# Patient Record
Sex: Male | Born: 1970 | Race: White | Hispanic: No | Marital: Married | Smoking: Never smoker
Health system: Southern US, Community
[De-identification: ages and names within clinical notes are randomized; demographics above are authoritative.]

## PROBLEM LIST (undated history)

## (undated) DIAGNOSIS — G629 Polyneuropathy, unspecified: Secondary | ICD-10-CM

## (undated) DIAGNOSIS — I10 Essential (primary) hypertension: Secondary | ICD-10-CM

## (undated) DIAGNOSIS — E119 Type 2 diabetes mellitus without complications: Secondary | ICD-10-CM

## (undated) HISTORY — PX: OTHER SURGICAL HISTORY: SHX169

---

## 2005-11-23 IMAGING — CR DG CHEST 2V
1 series · 2 of 2 positions shown · non-contrast
Comparison: none

REASON FOR EXAM: Shortness of breath
COMMENTS:

PROCEDURE:     DXR - DXR CHEST PA (OR AP) AND LATERAL  - [DATE]  [DATE]
RESULT:     The lung fields are clear. The heart, mediastinal and osseous
structures show no significant abnormalities.

[Series 1: view not recorded · 0.17mm/px · 2 of 2 slices shown]
[im 1/2]
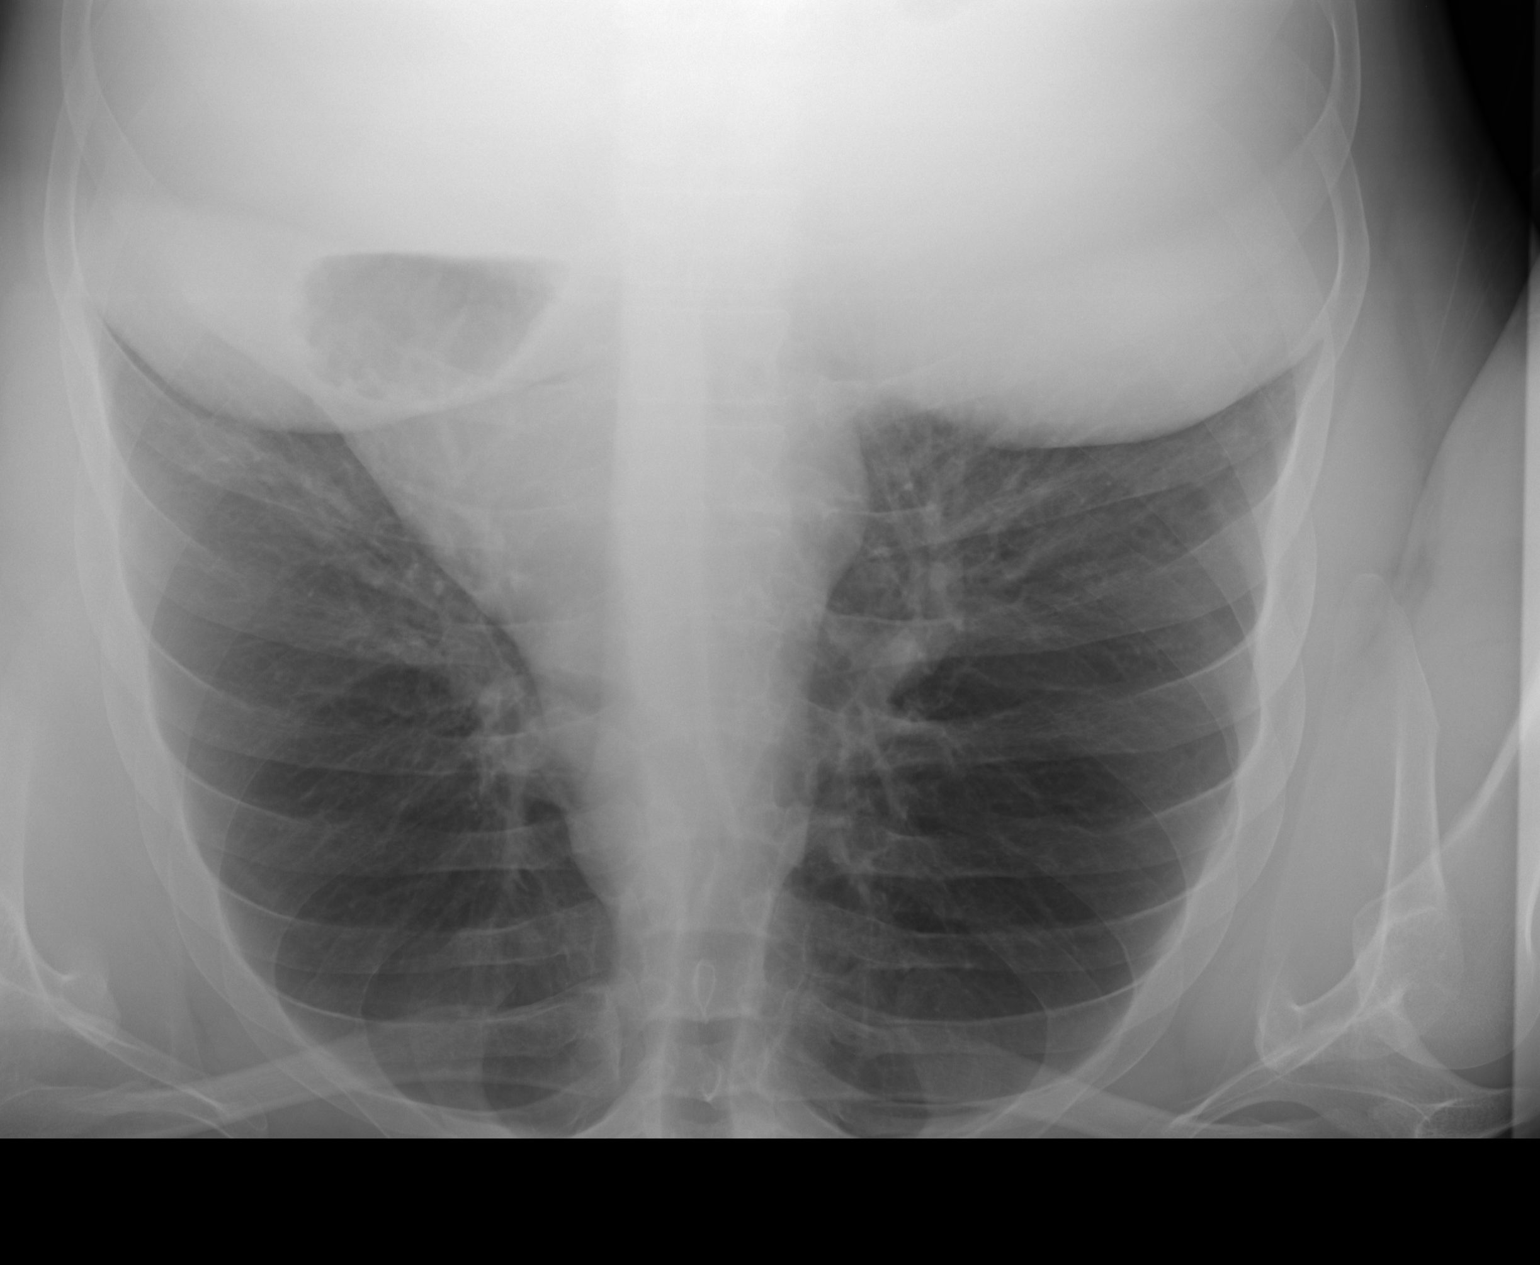
[im 2/2]
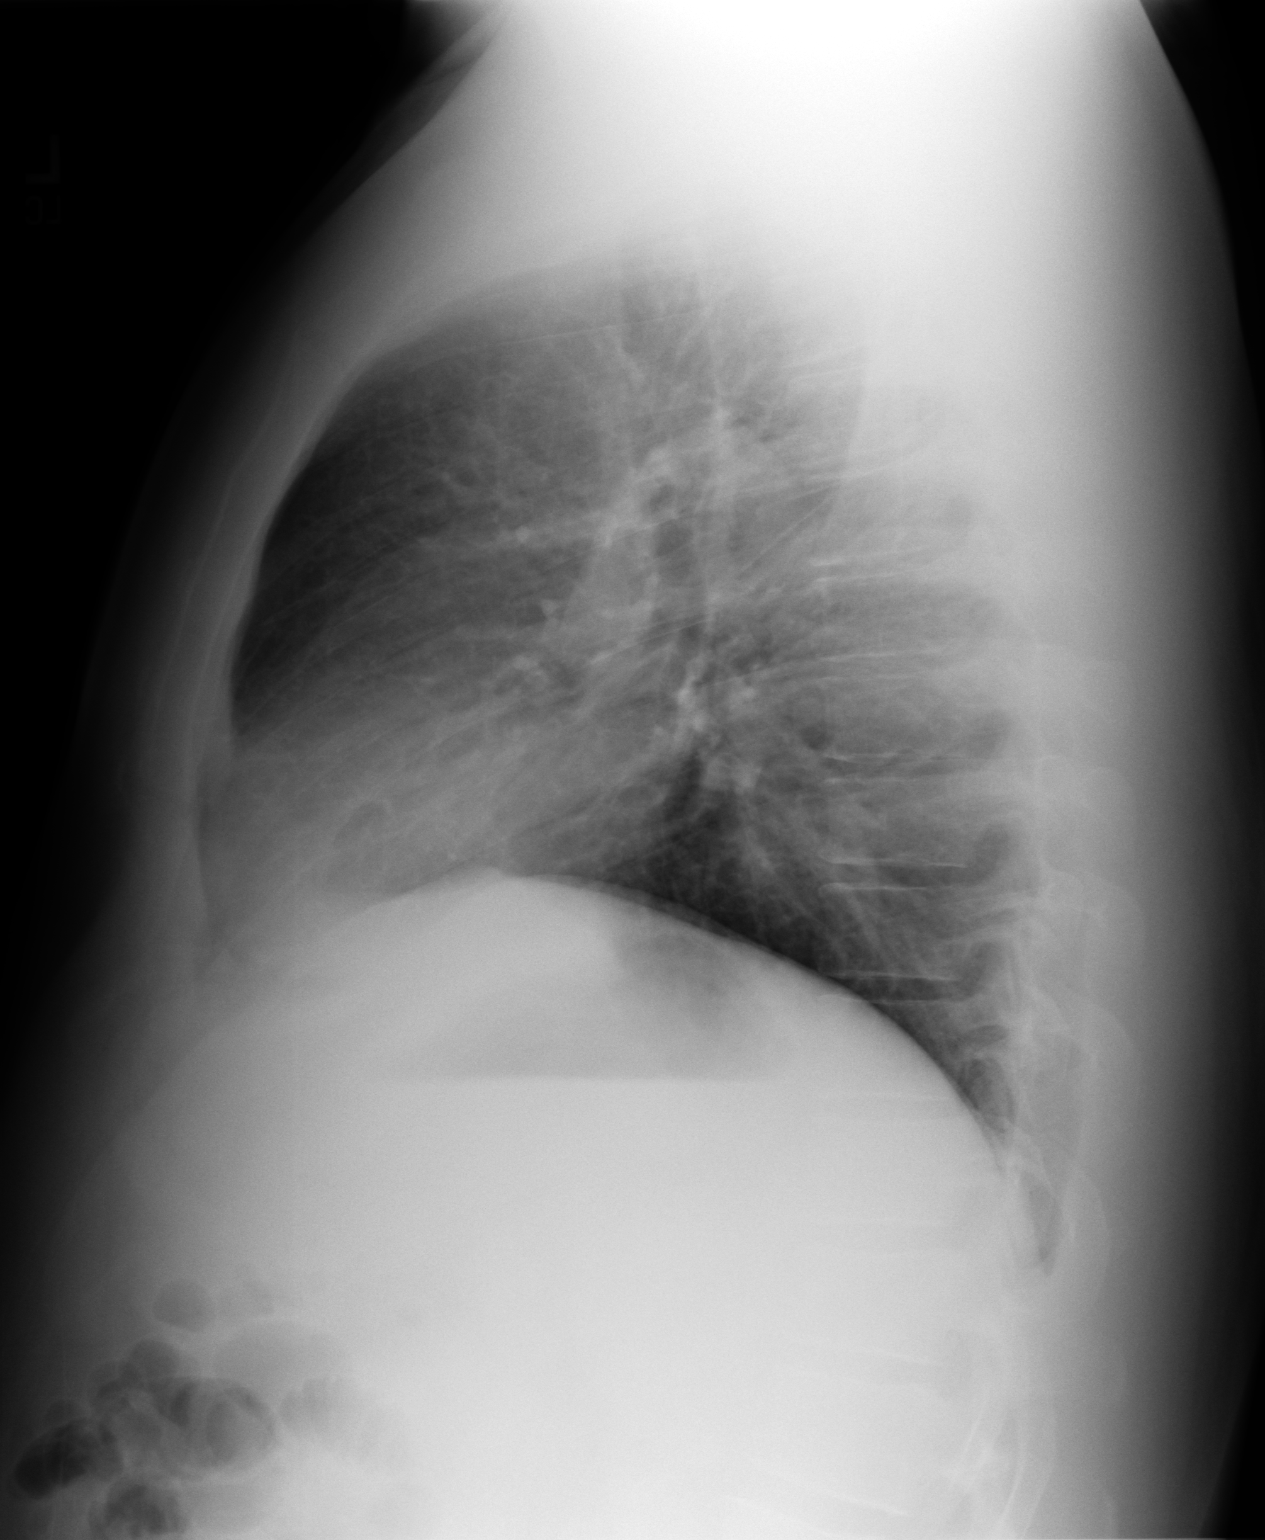

[2 of 2 positions shown; findings below may reference images not displayed]

IMPRESSION: No significant abnormalities are noted.

## 2005-11-24 ENCOUNTER — Inpatient Hospital Stay: Payer: Self-pay | Admitting: Internal Medicine

## 2021-08-03 ENCOUNTER — Observation Stay
Admission: EM | Admit: 2021-08-03 | Discharge: 2021-08-04 | Disposition: A | Discharge status: Home / Self Care | Payer: Self-pay | Attending: Internal Medicine | Admitting: Internal Medicine

## 2021-08-03 ENCOUNTER — Observation Stay: Payer: Self-pay

## 2021-08-03 ENCOUNTER — Other Ambulatory Visit: Payer: Self-pay

## 2021-08-03 ENCOUNTER — Emergency Department: Payer: Self-pay

## 2021-08-03 DIAGNOSIS — I639 Cerebral infarction, unspecified: Principal | ICD-10-CM | POA: Diagnosis present

## 2021-08-03 DIAGNOSIS — Z23 Encounter for immunization: Secondary | ICD-10-CM | POA: Insufficient documentation

## 2021-08-03 DIAGNOSIS — I6300 Cerebral infarction due to thrombosis of unspecified precerebral artery: Secondary | ICD-10-CM

## 2021-08-03 DIAGNOSIS — Z20822 Contact with and (suspected) exposure to covid-19: Secondary | ICD-10-CM | POA: Insufficient documentation

## 2021-08-03 DIAGNOSIS — G629 Polyneuropathy, unspecified: Secondary | ICD-10-CM

## 2021-08-03 DIAGNOSIS — R531 Weakness: Secondary | ICD-10-CM

## 2021-08-03 DIAGNOSIS — R609 Edema, unspecified: Secondary | ICD-10-CM

## 2021-08-03 DIAGNOSIS — I10 Essential (primary) hypertension: Secondary | ICD-10-CM | POA: Diagnosis present

## 2021-08-03 DIAGNOSIS — E119 Type 2 diabetes mellitus without complications: Secondary | ICD-10-CM

## 2021-08-03 HISTORY — DX: Essential (primary) hypertension: I10

## 2021-08-03 HISTORY — DX: Type 2 diabetes mellitus without complications: E11.9

## 2021-08-03 HISTORY — DX: Polyneuropathy, unspecified: G62.9

## 2021-08-03 LAB — COMPREHENSIVE METABOLIC PANEL
ALT: 24 U/L (ref 0–44)
AST: 23 U/L (ref 15–41)
Albumin: 4.3 g/dL (ref 3.5–5.0)
Alkaline Phosphatase: 60 U/L (ref 38–126)
Anion gap: 10 (ref 5–15)
BUN: 16 mg/dL (ref 6–20)
CO2: 26 mmol/L (ref 22–32)
Calcium: 9.6 mg/dL (ref 8.9–10.3)
Chloride: 99 mmol/L (ref 98–111)
Creatinine, Ser: 1.1 mg/dL (ref 0.61–1.24)
GFR, Estimated: >60 mL/min (ref >60)
Glucose, Bld: 245 mg/dL — ABNORMAL HIGH (ref 70–99)
Potassium: 3.7 mmol/L (ref 3.5–5.1)
Sodium: 135 mmol/L (ref 135–145)
Total Bilirubin: 2.1 mg/dL — ABNORMAL HIGH (ref 0.3–1.2)
Total Protein: 8.2 g/dL — ABNORMAL HIGH (ref 6.5–8.1)

## 2021-08-03 LAB — CBC
HCT: 42.9 % (ref 39.0–52.0)
Hemoglobin: 14.8 g/dL (ref 13.0–17.0)
MCH: 30.6 pg (ref 26.0–34.0)
MCHC: 34.5 g/dL (ref 30.0–36.0)
MCV: 88.8 fL (ref 80.0–100.0)
Platelets: 265 10*3/uL (ref 150–400)
RBC: 4.83 MIL/uL (ref 4.22–5.81)
RDW: 12.3 % (ref 11.5–15.5)
WBC: 7.6 10*3/uL (ref 4.0–10.5)
nRBC: 0 % (ref 0.0–0.2)

## 2021-08-03 LAB — PROTIME-INR
INR: 1 (ref 0.8–1.2)
Prothrombin Time: 13.4 seconds (ref 11.4–15.2)

## 2021-08-03 LAB — GLUCOSE, CAPILLARY
Glucose-Capillary: 254 mg/dL — ABNORMAL HIGH (ref 70–99)
Glucose-Capillary: 187 mg/dL — ABNORMAL HIGH (ref 70–99)

## 2021-08-03 LAB — DIFFERENTIAL
Abs Immature Granulocytes: 0.02 10*3/uL (ref 0.00–0.07)
Basophils Absolute: 0 10*3/uL (ref 0.0–0.1)
Basophils Relative: 1 %
Eosinophils Absolute: 0.2 10*3/uL (ref 0.0–0.5)
Eosinophils Relative: 2 %
Immature Granulocytes: 0 %
Lymphocytes Relative: 35 %
Lymphs Abs: 2.7 10*3/uL (ref 0.7–4.0)
Monocytes Absolute: 0.6 10*3/uL (ref 0.1–1.0)
Monocytes Relative: 8 %
Neutro Abs: 4.1 10*3/uL (ref 1.7–7.7)
Neutrophils Relative %: 54 %

## 2021-08-03 LAB — APTT: aPTT: 30 seconds (ref 24–36)

## 2021-08-03 LAB — CBG MONITORING, ED: Glucose-Capillary: 225 mg/dL — ABNORMAL HIGH (ref 70–99)

## 2021-08-03 LAB — RESP PANEL BY RT-PCR (FLU A&B, COVID) ARPGX2
Influenza A by PCR: NEGATIVE
Influenza B by PCR: NEGATIVE
SARS Coronavirus 2 by RT PCR: NEGATIVE

## 2021-08-03 IMAGING — CT CT ANGIO NECK
2 of 7 series · 9 of 36 positions shown · IV contrast (omnipaque)
Comparison: Prior brain MRI from earlier the same day.

CLINICAL DATA: Follow-up examination for acute stroke.

EXAM:
CT ANGIOGRAPHY NECK
TECHNIQUE: Multidetector CT imaging of the neck was performed using the
standard protocol during bolus administration of intravenous
contrast. Multiplanar CT image reconstructions and MIPs were
obtained to evaluate the vascular anatomy. Carotid stenosis
measurements (when applicable) are obtained utilizing NASCET
criteria, using the distal internal carotid diameter as the
denominator.
CONTRAST:  75mL OMNIPAQUE IOHEXOL 350 MG/ML SOLN

[Series 6: ax thin · axial · 0.43mm/px · z∈[-369,-178]mm · 7 of 270 slices shown]
[im 34/270  soft-tissue]
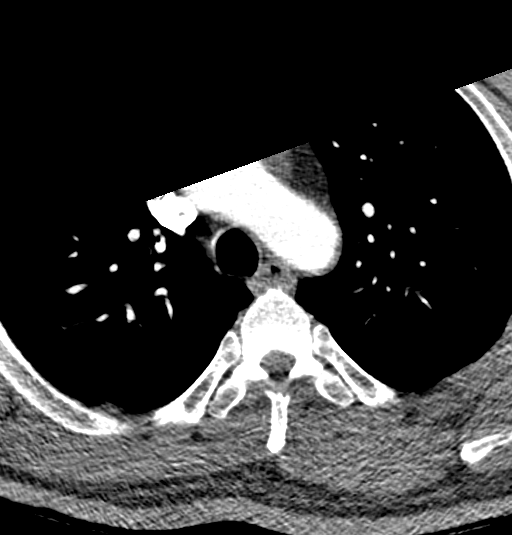
[im 68/270  bone]
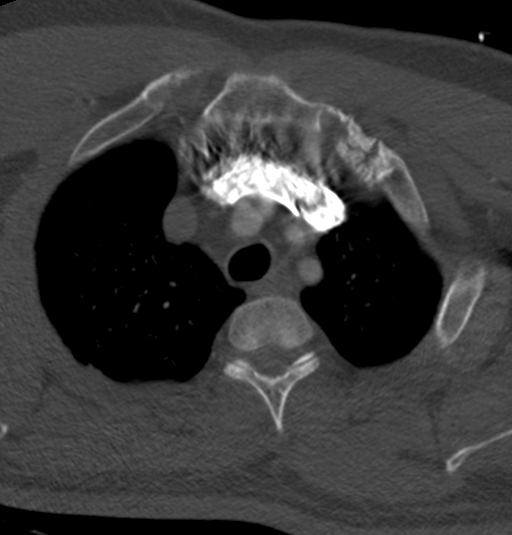
[im 101/270  soft-tissue]
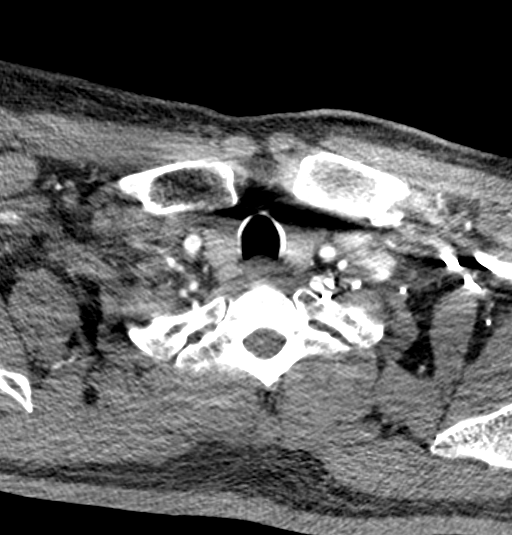
[im 135/270  bone]
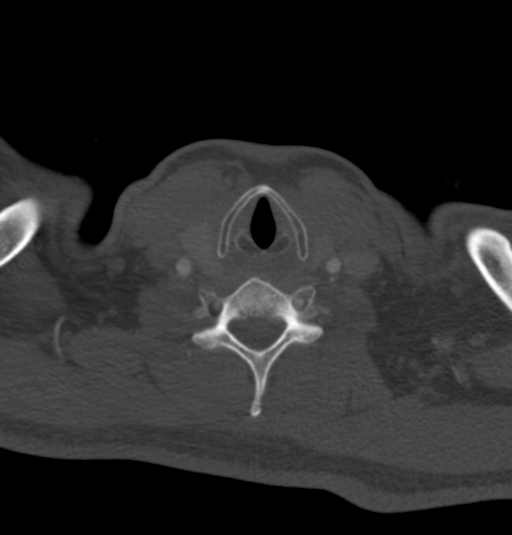
[im 169/270  soft-tissue]
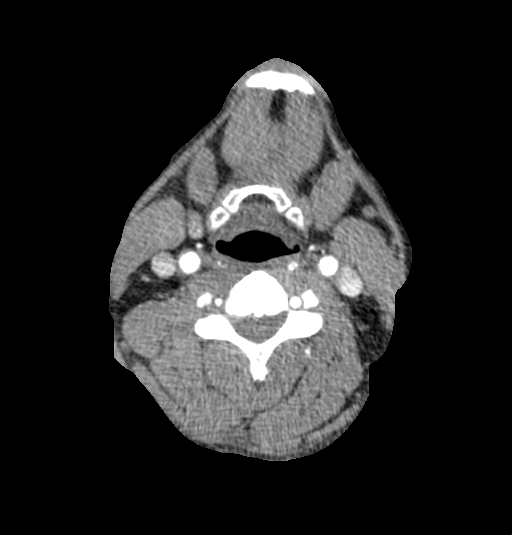
[im 202/270  bone]
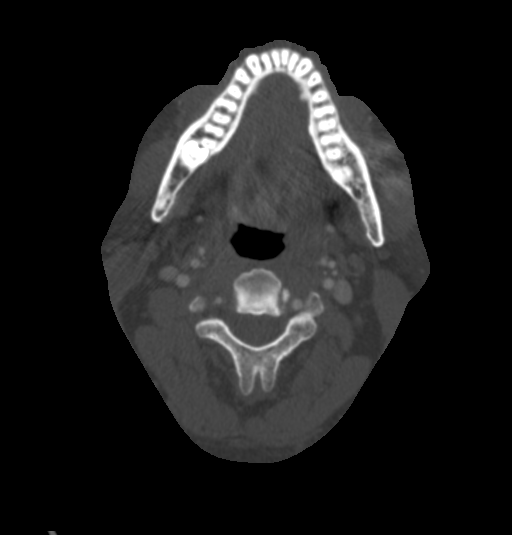
[im 236/270  soft-tissue]
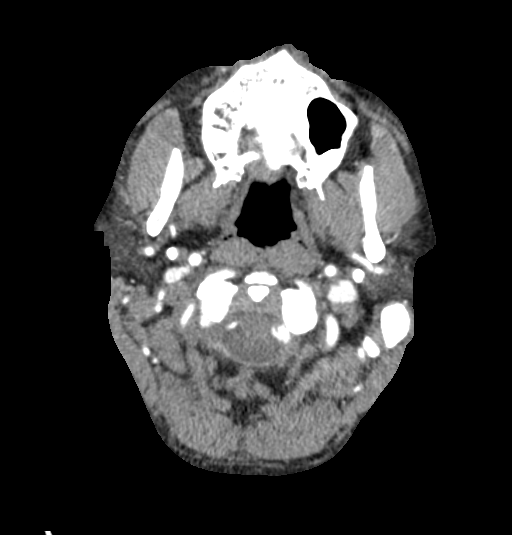

[Series 8: sagittal thin · sagittal · 0.46mm/px · 2 of 158 slices shown]
[im 34/158  soft-tissue]
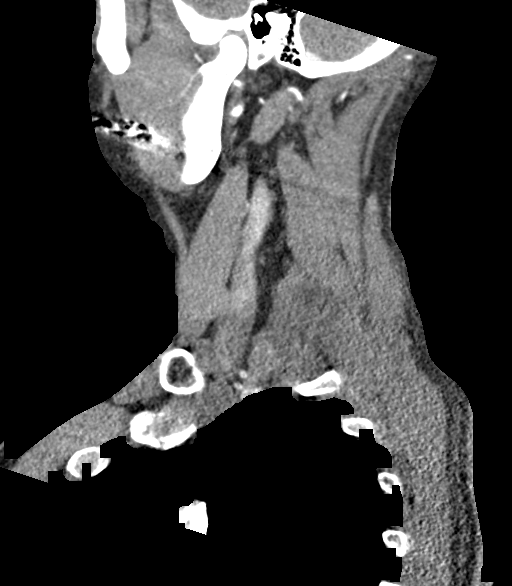
[im 125/158  soft-tissue]
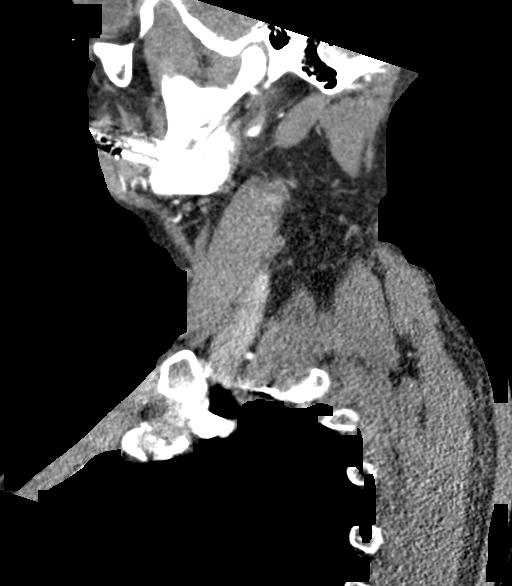

[9 of 36 positions shown; findings below may reference images not displayed]

FINDINGS: Aortic arch: Visualized aortic arch normal in caliber with normal
branch pattern. Minimal atheromatous change about the origin of the
great vessels without significant stenosis.

Right carotid system: Right CCA patent from its origin to the
bifurcation without stenosis. Mild mixed plaque about the right
carotid bulb without significant stenosis. Right ICA patent distally
without stenosis, dissection or occlusion. Atheromatous change noted
within the partially visualized right carotid siphon.

Left carotid system: Left CCA patent from its origin to the
bifurcation without stenosis. Mild calcified plaque about the left
carotid bulb/proximal left ICA without significant stenosis. Left
ICA patent distally without stenosis, dissection or occlusion.
Scattered atheromatous change noted within the partially visualized
left carotid siphon without visible stenosis.

Vertebral arteries: Both vertebral arteries arise from the
subclavian arteries. No significant proximal subclavian artery
stenosis. Left vertebral artery dominant, with a diffusely
diminutive right vertebral artery. Atheromatous change noted at the
origin of the dominant left vertebral artery without definite
significant stenosis. Vertebral arteries otherwise patent distally
without stenosis, evidence for dissection or occlusion. Diminutive
right vertebral artery terminates in PICA. 4 mm partially calcified
density adjacent to the right PICA of could reflect a small
thrombosed aneurysm (series 6, image 241). No internal flow seen
within this lesion. Origin of the left PICA normal. Partially
visualized basilar widely patent.

Skeleton: No discrete or worrisome osseous lesions. No significant
spondylosis for age.

Other neck: No other acute soft tissue abnormality within the neck.
No mass or adenopathy.

Upper chest: Visualized upper chest demonstrates no acute finding.
IMPRESSION: 1. Mild atheromatous change about the carotid bifurcations/proximal
ICAs without hemodynamically significant stenosis.
2. Wide patency of the vertebral arteries within the neck. Left
vertebral artery dominant. Right vertebral artery diminutive and
terminates in PICA.
3. 4 mm partially calcified lesion adjacent to the right PICA,
possibly reflecting a small thrombosed aneurysm. This is likely of
doubtful significance, as no appreciable internal flow is
visualized.

## 2021-08-03 IMAGING — MR MR HEAD W/O CM
13 series · 48 of 48 positions shown · non-contrast
Comparison: No pertinent prior exam.

CLINICAL DATA: Neuro deficit, acute, stroke suspected

EXAM:
MRI HEAD WITHOUT CONTRAST
MRA HEAD WITHOUT CONTRAST
TECHNIQUE: Multiplanar, multi-echo pulse sequences of the brain and surrounding
structures were acquired without intravenous contrast. Angiographic
images of the Circle of Willis were acquired using MRA technique
without intravenous contrast.

[Series 5: ax dwi_tracew · axial · 3.0mm · 0.65mm/px · z∈[-56,+98]mm · 2 of 48 slices shown]
[im 1/48]
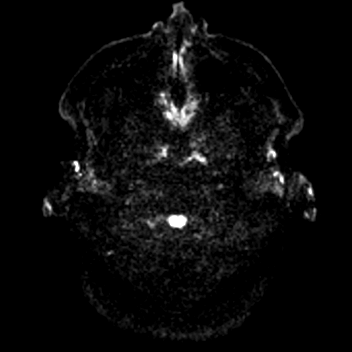
[im 48/48]
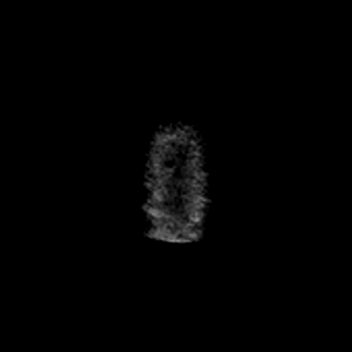

[Series 6: ax dwi_adc · axial · 3.0mm · 0.65mm/px · z∈[-56,+98]mm · 2 of 48 slices shown]
[im 1/48]
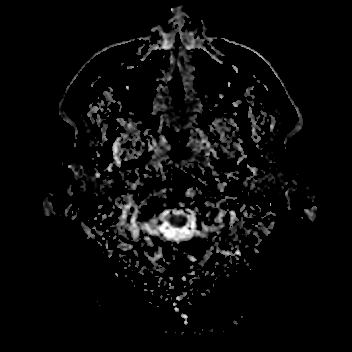
[im 48/48]
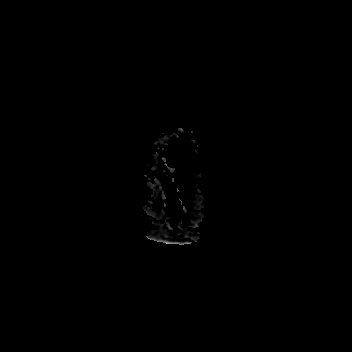

[Series 7: cor dwi_tracew · coronal · 5.0mm · 1.80mm/px · 3 of 38 slices shown]
[im 1/38]
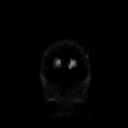
[im 19/38]
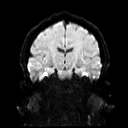
[im 38/38]
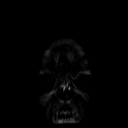

[Series 8: cor dwi_adc · coronal · 5.0mm · 1.80mm/px · 3 of 38 slices shown]
[im 1/38]
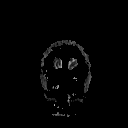
[im 19/38]
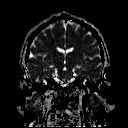
[im 38/38]
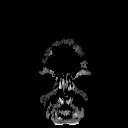

[Series 9: FLAIR · axial · 3.0mm · 0.53mm/px · z∈[-60,+101]mm · 4 of 55 slices shown (1 of 2)]
[im 1/55]
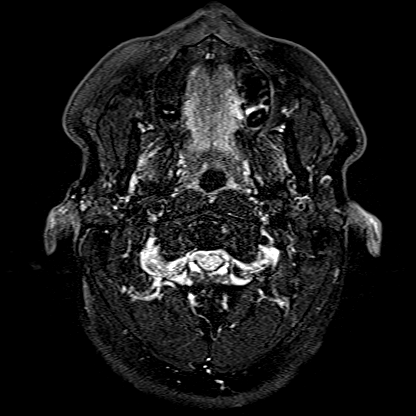
[im 19/55]
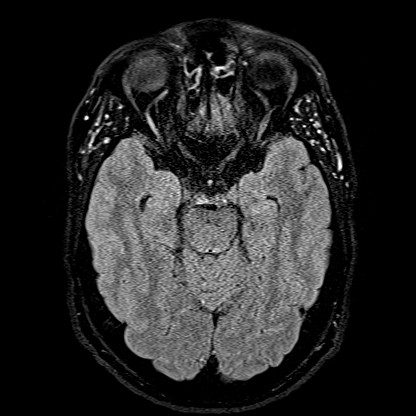
[im 37/55]
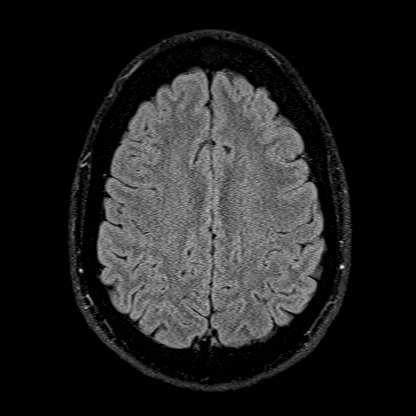
[im 55/55]
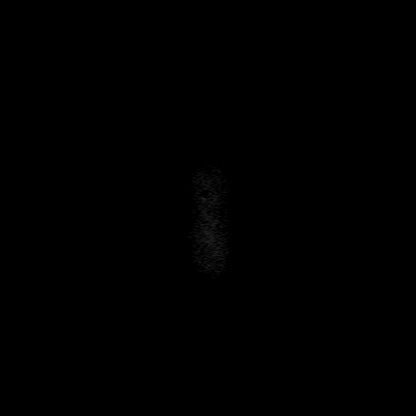

[Series 10: mag_images · axial · 3.0mm · 0.90mm/px · z∈[-66,+109]mm · 4 of 60 slices shown]
[im 1/60]
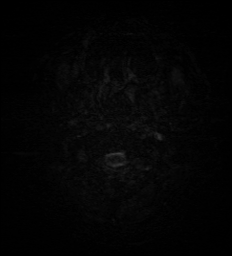
[im 20/60]
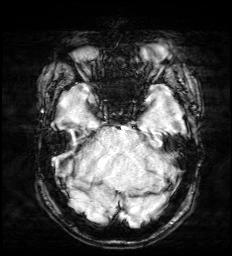
[im 40/60]
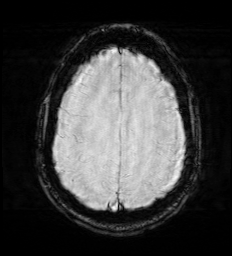
[im 60/60]
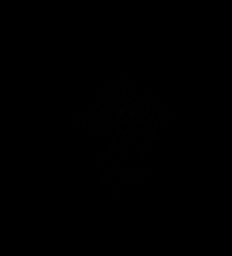

[Series 11: pha_images · axial · 3.0mm · 0.90mm/px · z∈[-66,+106]mm · 4 of 58 slices shown]
[im 1/58]
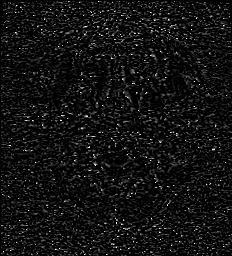
[im 20/58]
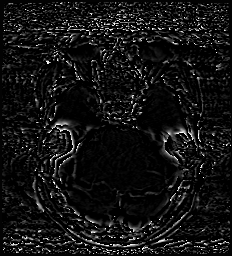
[im 39/58]
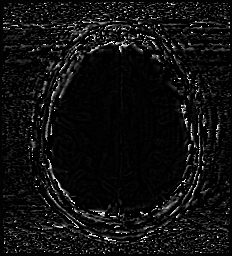
[im 58/58]
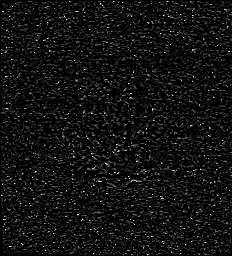

[Series 12: swi_images · axial · 3.0mm · 0.90mm/px · z∈[-66,+109]mm · 4 of 60 slices shown]
[im 1/60]
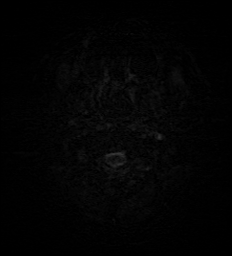
[im 20/60]
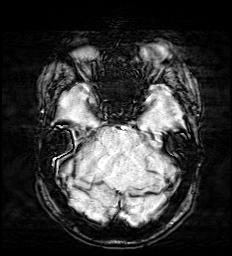
[im 40/60]
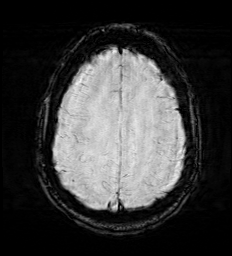
[im 60/60]
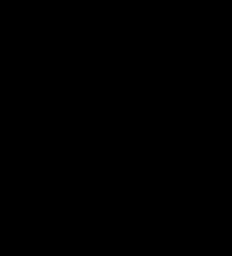

[Series 14: T1 · sagittal · 5.0mm · 0.62mm/px · 2 of 23 slices shown (1 of 2)]
[im 1/23]
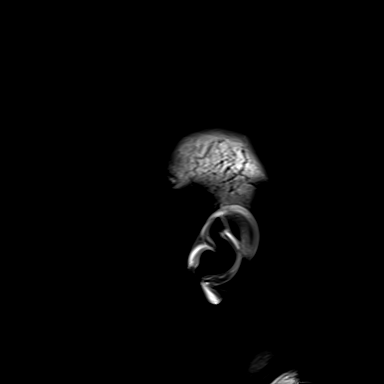
[im 23/23]
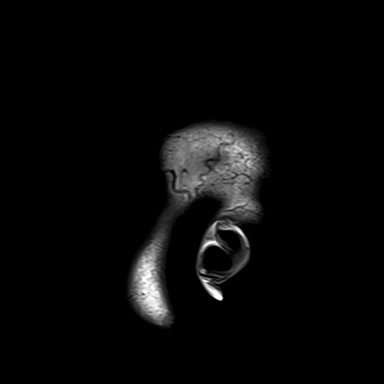

[Series 15: T2 · axial · 5.0mm · 0.53mm/px · z∈[-51,+92]mm · 2 of 25 slices shown (1 of 2)]
[im 1/25]
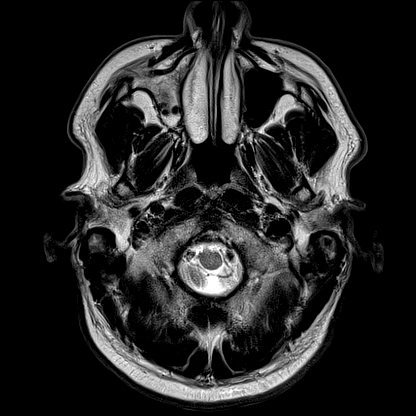
[im 25/25]
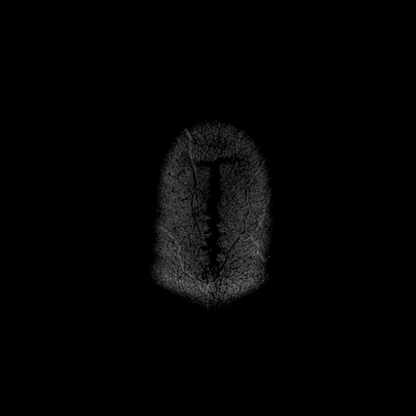

[Series 16: T1 · axial · 1.0mm · 0.98mm/px · z∈[-65,+109]mm · 12 of 176 slices shown (2 of 2)]
[im 1/176]
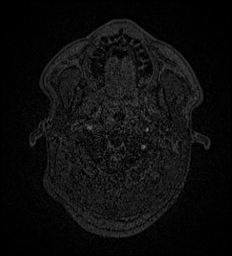
[im 16/176]
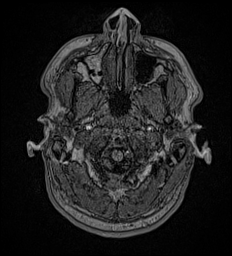
[im 32/176]
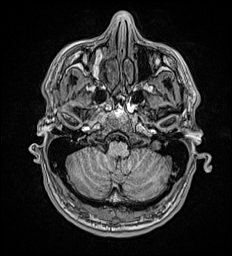
[im 48/176]
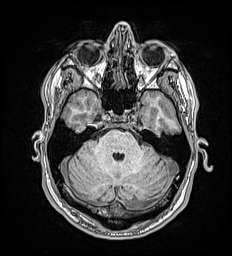
[im 64/176]
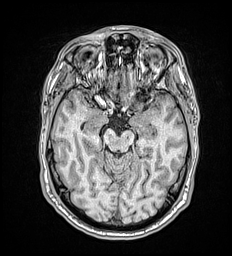
[im 80/176]
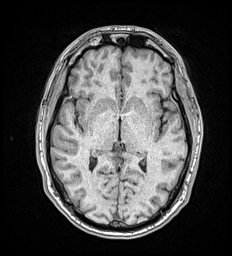
[im 96/176]
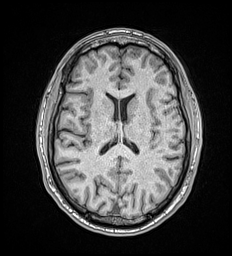
[im 112/176]
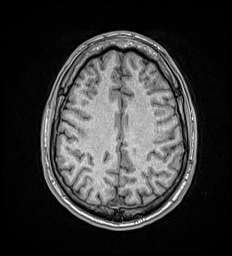
[im 128/176]
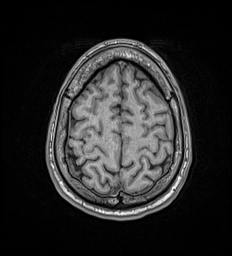
[im 144/176]
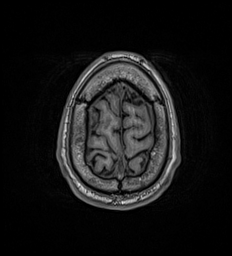
[im 160/176]
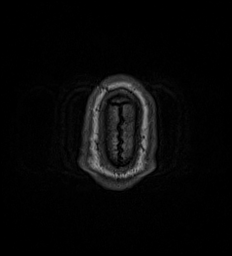
[im 176/176]
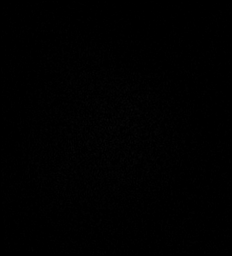

[Series 17: T2 · coronal · 5.0mm · 0.57mm/px · 2 of 29 slices shown (2 of 2)]
[im 1/29]
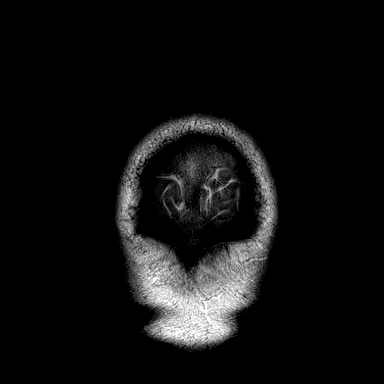
[im 29/29]
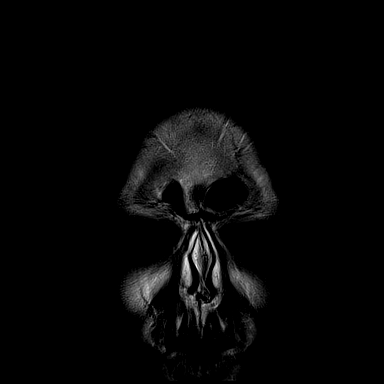

[Series 18: FLAIR · axial · 3.0mm · 0.53mm/px · z∈[-60,+101]mm · 4 of 55 slices shown (2 of 2)]
[im 1/55]
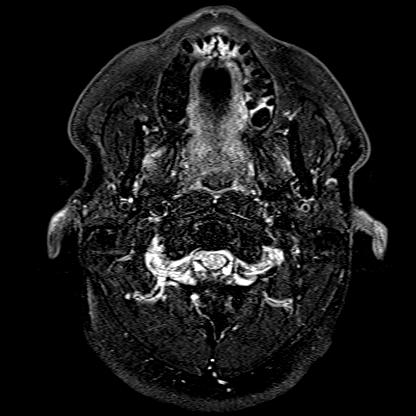
[im 19/55]
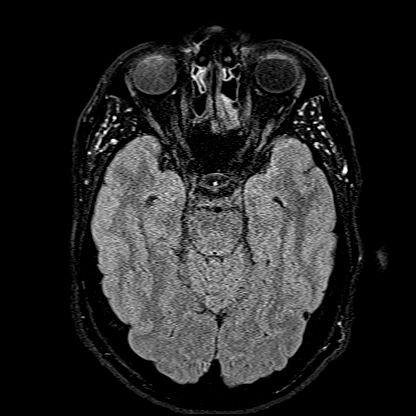
[im 37/55]
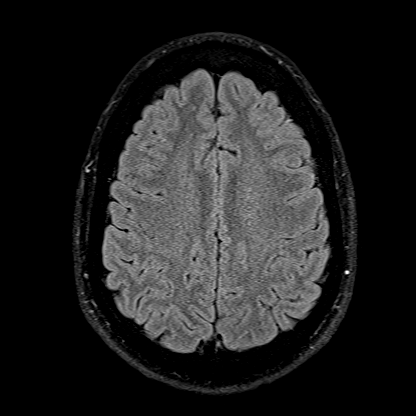
[im 55/55]
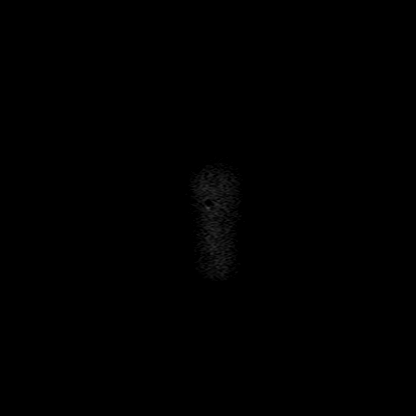

[48 of 48 positions shown; findings below may reference images not displayed]

FINDINGS: MRI HEAD FINDINGS

Brain: There is a 4 mm focus of reduced diffusion at the right
ventral aspect of the superior medulla. No evidence of intracranial
hemorrhage. Ventricles and sulci are normal in size and
configuration. There is no intracranial mass, mass effect,
hydrocephalus, or extra-axial collection. Few patchy foci of T2
hyperintensity in the supratentorial white matter likely reflects
nonspecific gliosis/demyelination.

Vascular: Diminished right vertebral artery flow void.

Skull and upper cervical spine: Normal marrow signal is preserved.

Sinuses/Orbits: Patchy mucosal thickening. Right maxillary sinus is
opacified. Orbits are unremarkable.

Other: Mastoid air cells are clear.

MRA HEAD FINDINGS

Anterior circulation: Intracranial internal carotid arteries are
patent with atherosclerotic irregularity. There is mild stenosis at
the level of the clinoid on the left. Anterior cerebral arteries are
patent. Anterior communicating artery is present. Middle cerebral
arteries are patent with mild atherosclerotic irregularity.

Posterior circulation: Intracranial left vertebral artery is patent.
Proximal right vertebral artery is patent to the PICA origin. There
is no apparent flow beyond this despite vessel being present on the
MRI brain. Basilar artery is patent. Posterior cerebral arteries are
patent.
IMPRESSION: Small acute infarct right ventral medulla.

Intracranial right vertebral artery is patent to the PICA origin.
Beyond this, the vessel appears to be present on MRI brain but there
is no flow visible on MRA.

## 2021-08-03 IMAGING — CT CT HEAD W/O CM
3 series · 16 of 47 positions shown, 19 images · non-contrast
Comparison: None.

CLINICAL DATA: Neurologic deficit.

EXAM:
CT HEAD WITHOUT CONTRAST
TECHNIQUE: Contiguous axial images were obtained from the base of the skull
through the vertex without intravenous contrast.

[Series 3: head wo · axial · 0.44mm/px · z∈[-138,-13]mm · 10 of 31 slices shown, 13 images]
[im 3/31  brain]
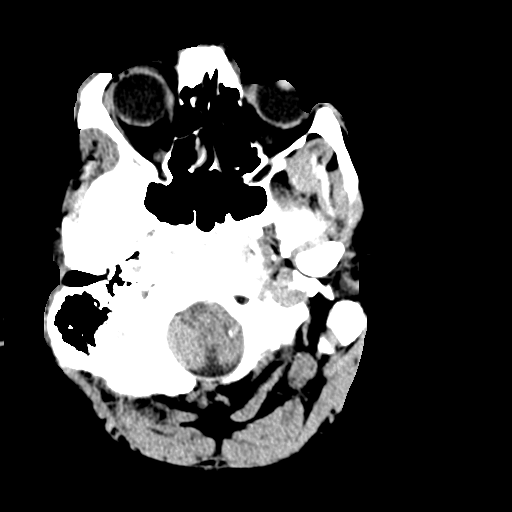
[im 3/31  bone]
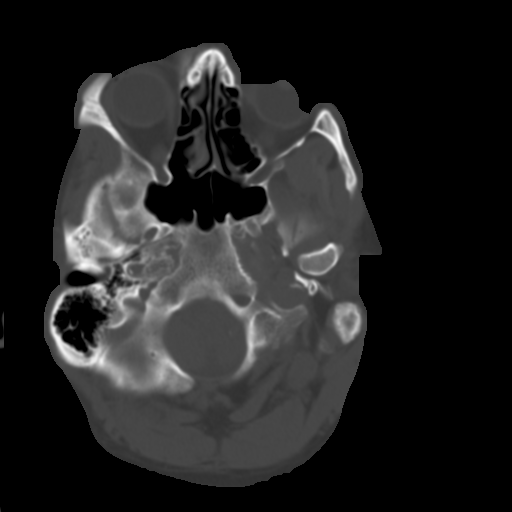
[im 6/31  brain]
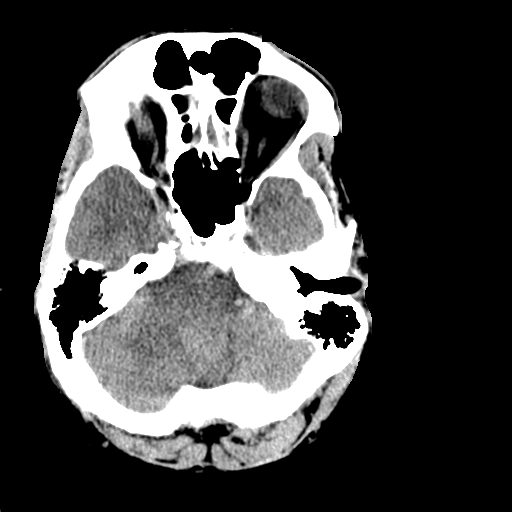
[im 9/31  brain]
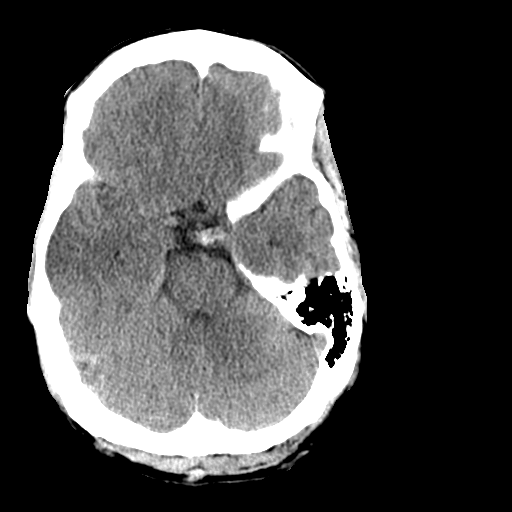
[im 11/31  brain]
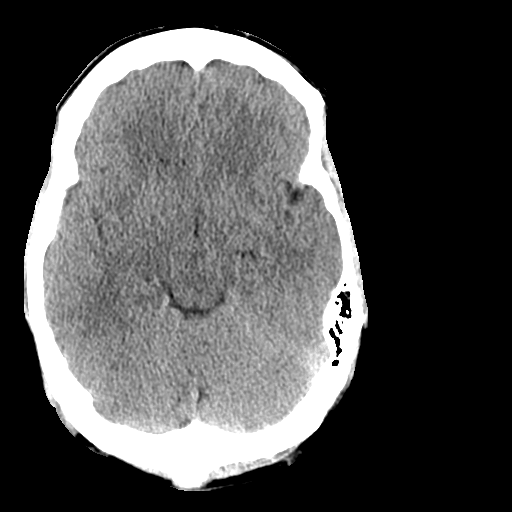
[im 14/31  brain]
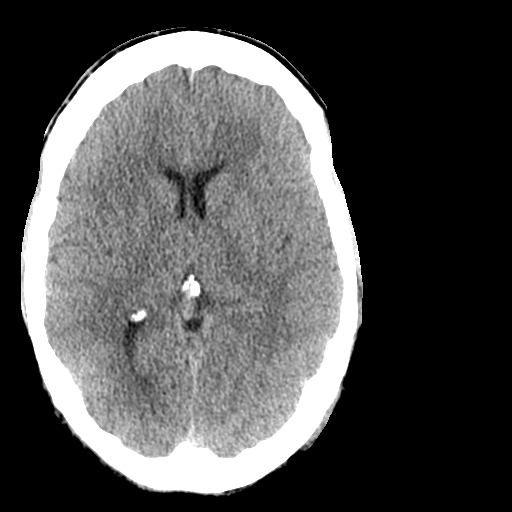
[im 14/31  bone]
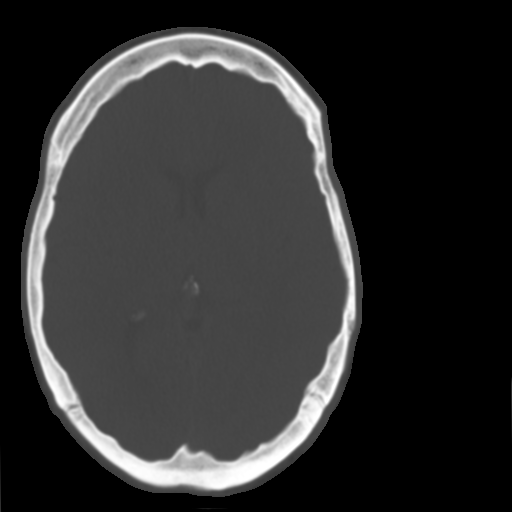
[im 17/31  brain]
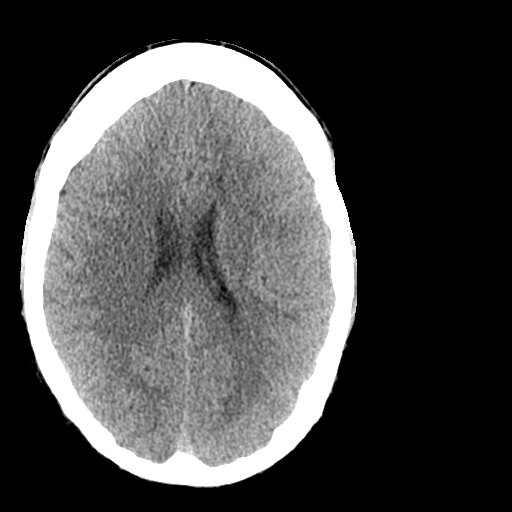
[im 20/31  brain]
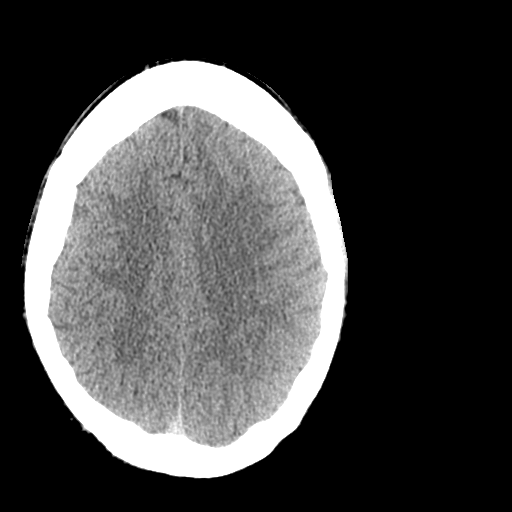
[im 23/31  brain]
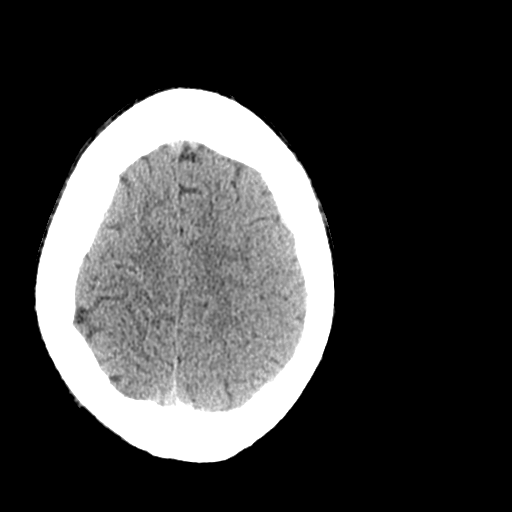
[im 25/31  brain]
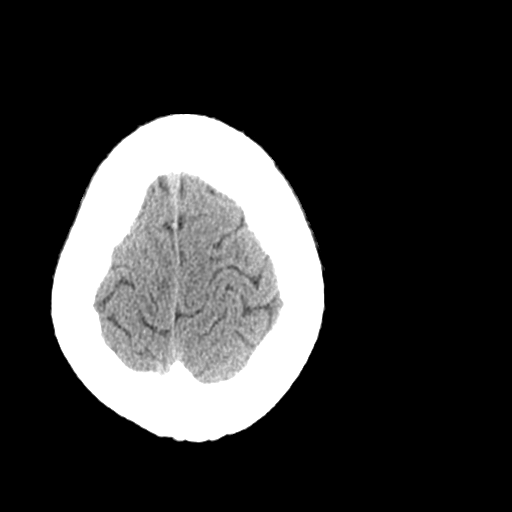
[im 25/31  bone]
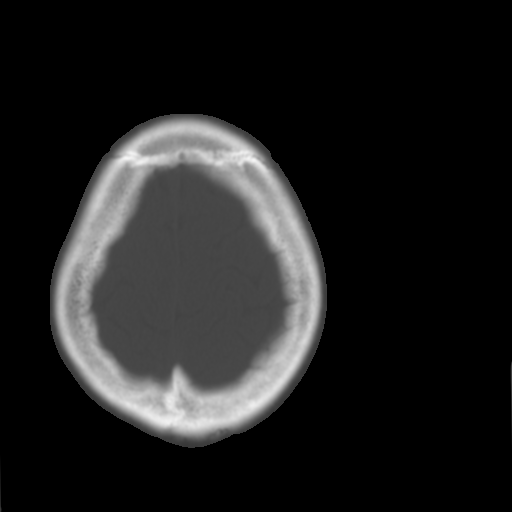
[im 28/31  brain]
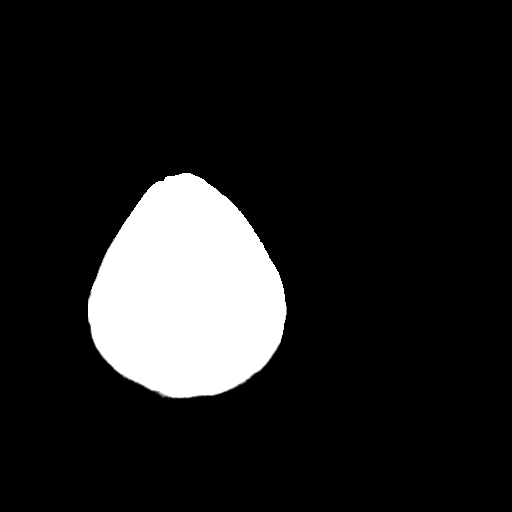

[Series 4: coronal soft tissue · coronal · 0.33mm/px · 3 of 73 slices shown]
[im 25/73  brain]
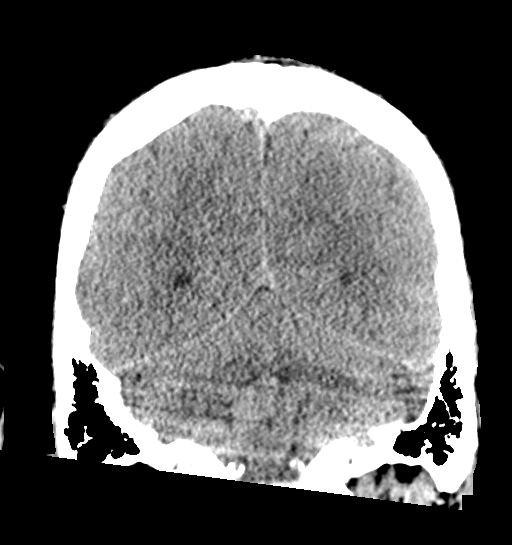
[im 33/73  brain]
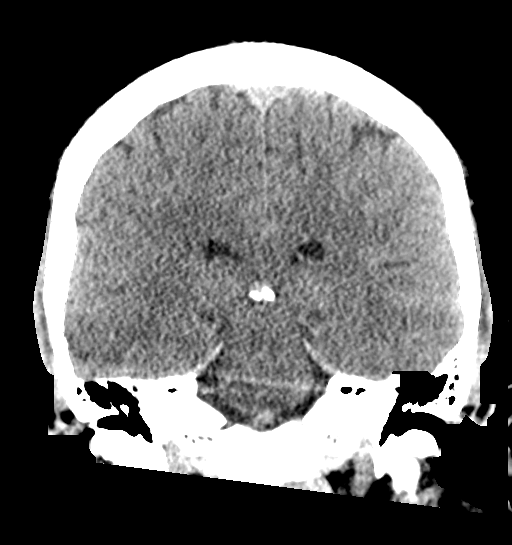
[im 41/73  brain]
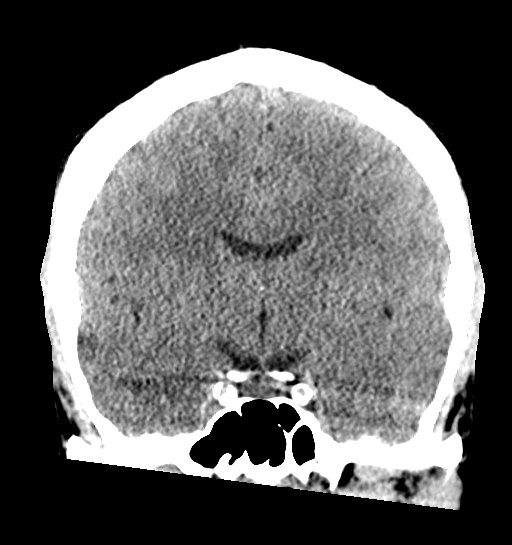

[Series 5: sagittal soft tissue · sagittal · 0.35mm/px · 3 of 57 slices shown]
[im 20/57  brain]
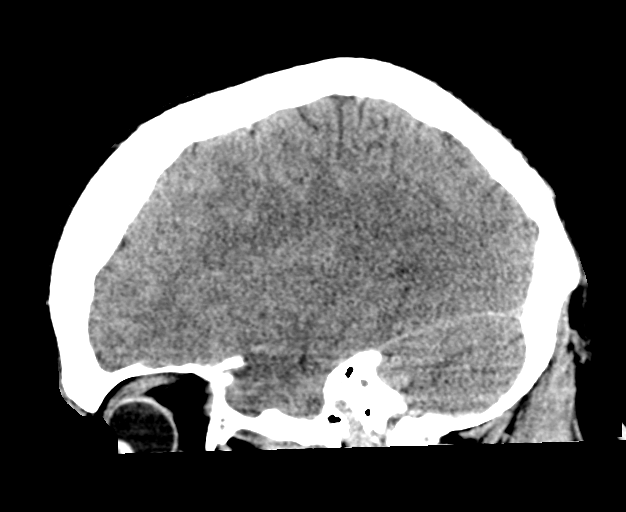
[im 29/57  brain]
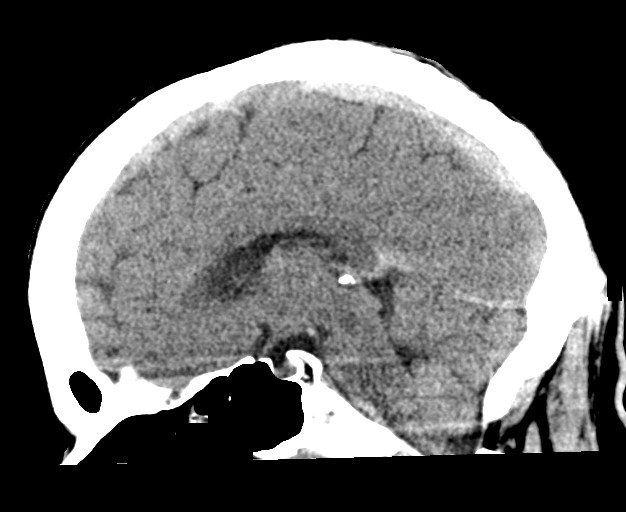
[im 37/57  brain]
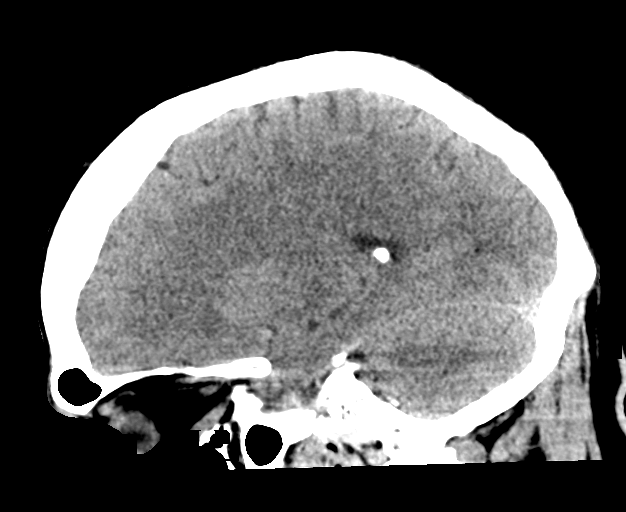

[16 of 47 positions shown; findings below may reference images not displayed]

FINDINGS: Brain: The ventricles and sulci are appropriate size for the
patient's age. The gray-white matter discrimination is preserved.
There is no acute intracranial hemorrhage. No mass effect or midline
shift no extra-axial fluid collection.

Vascular: No hyperdense vessel or unexpected calcification.

Skull: Normal. Negative for fracture or focal lesion.

Sinuses/Orbits: Diffuse mucoperiosteal thickening of paranasal
sinuses. The mastoid air cells are clear. No air-fluid level.

Other: None
IMPRESSION: 1. Unremarkable noncontrast CT of the brain.
2. Paranasal sinus disease.

## 2021-08-03 IMAGING — MR MR CERVICAL SPINE W/O CM
6 series · 40 of 48 positions shown · non-contrast
Comparison: None.

CLINICAL DATA: Spinal stenosis, C-spine. Left arm numbness and left
sided extremity weakness that started [REDACTED].

EXAM:
MRI CERVICAL SPINE WITHOUT CONTRAST
TECHNIQUE: Multiplanar, multisequence MR imaging of the cervical spine was
performed. No intravenous contrast was administered.

[Series 5: T2 · sagittal · 3.0mm · 0.62mm/px · 5 of 15 slices shown (1 of 2)]
[im 1/15]
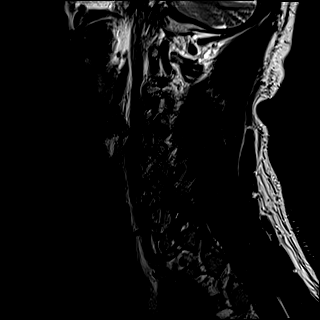
[im 4/15]
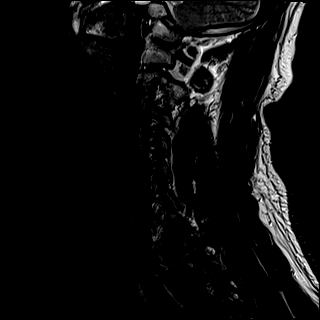
[im 8/15]
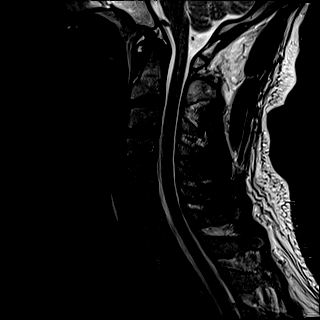
[im 11/15]
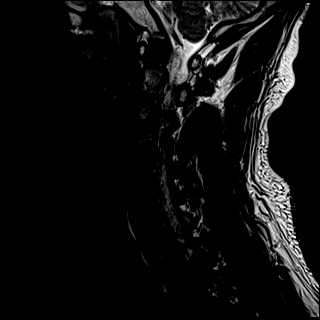
[im 15/15]
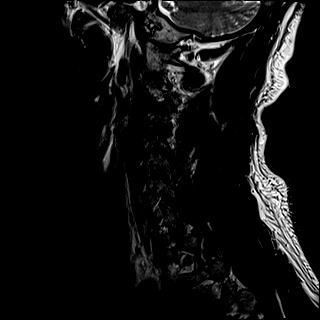

[Series 6: FLAIR · sagittal · 3.0mm · 0.78mm/px · 5 of 15 slices shown (1 of 2)]
[im 1/15]
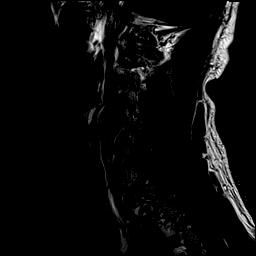
[im 4/15]
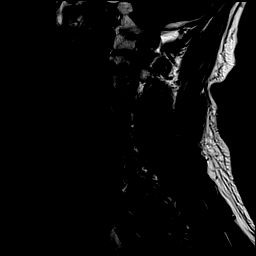
[im 8/15]
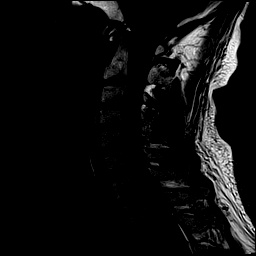
[im 11/15]
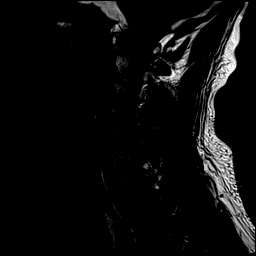
[im 15/15]
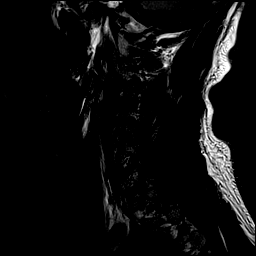

[Series 7: STIR · sagittal · 3.0mm · 0.62mm/px · 6 of 15 slices shown]
[im 1/15]
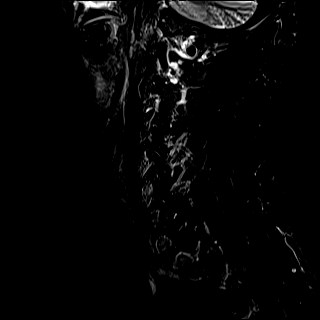
[im 3/15]
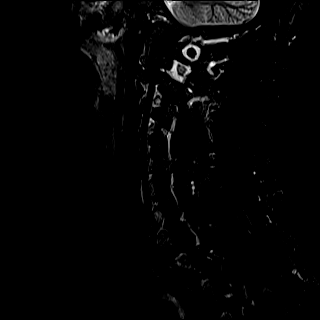
[im 6/15]
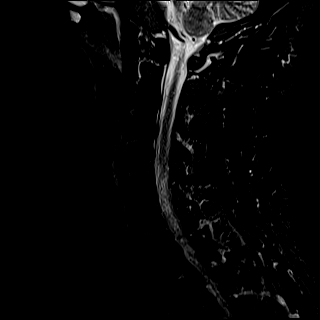
[im 9/15]
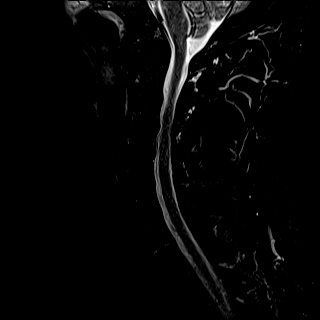
[im 12/15]
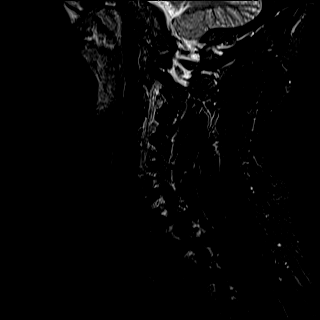
[im 15/15]
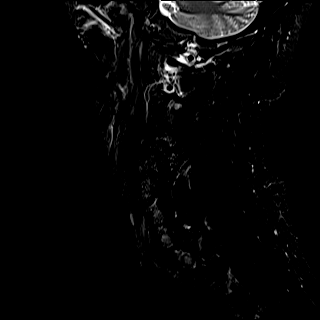

[Series 8: T2 · axial · 3.0mm · 0.70mm/px · z∈[-208,-96]mm · 10 of 33 slices shown (2 of 2)]
[im 1/33]
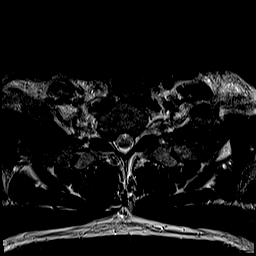
[im 3/33]
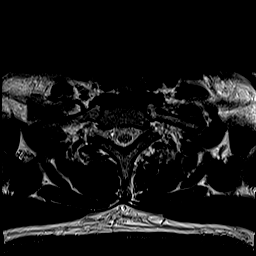
[im 6/33]
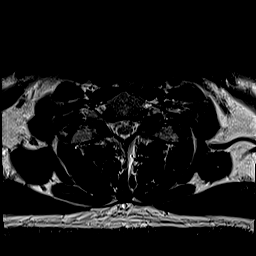
[im 11/33]
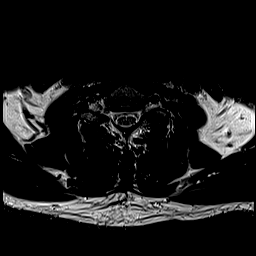
[im 14/33]
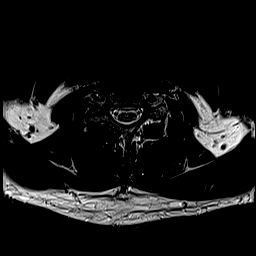
[im 17/33]
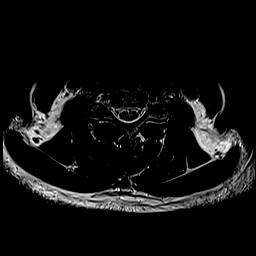
[im 19/33]
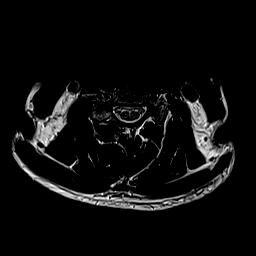
[im 22/33]
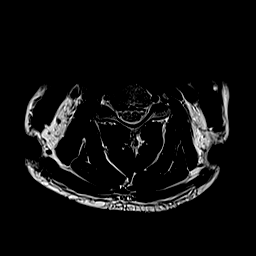
[im 27/33]
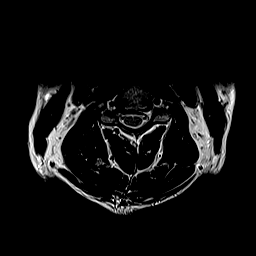
[im 33/33]
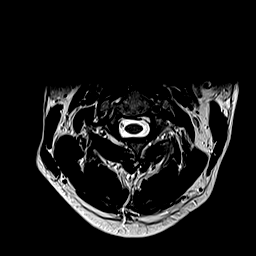

[Series 9: ax mpgr · axial · 3.0mm · 0.35mm/px · z∈[-208,-96]mm · 8 of 33 slices shown]
[im 1/33]
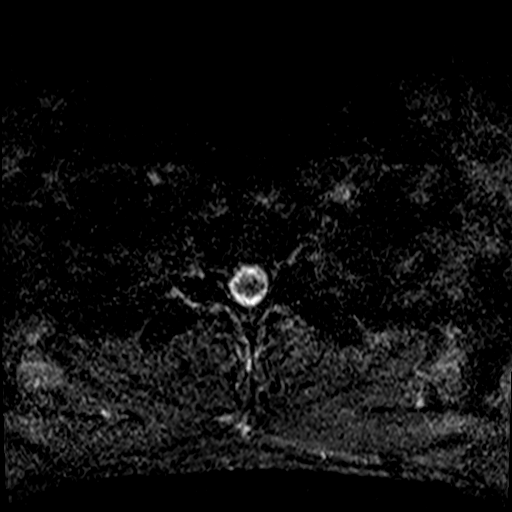
[im 6/33]
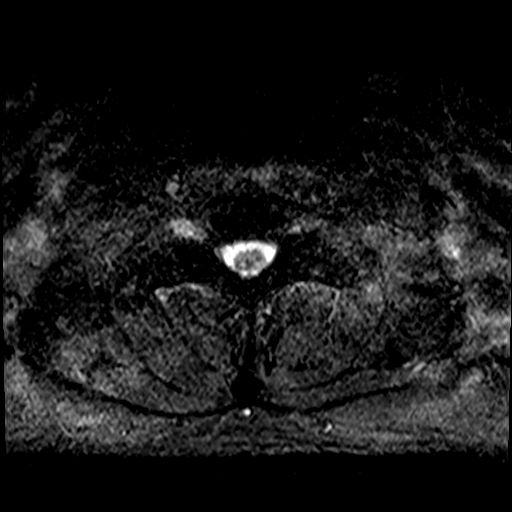
[im 11/33]
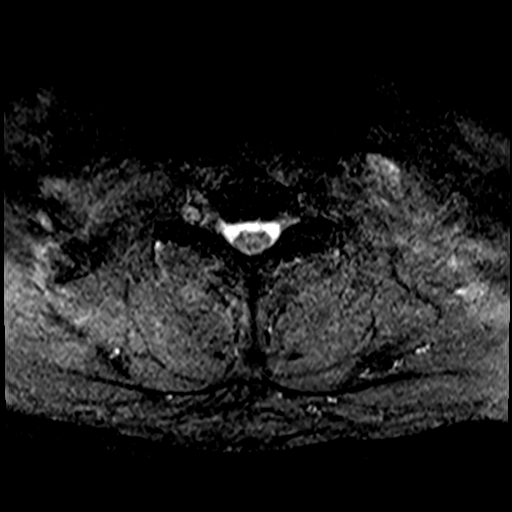
[im 14/33]
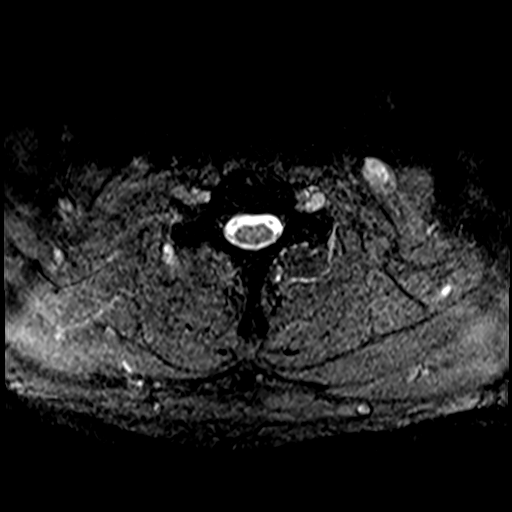
[im 19/33]
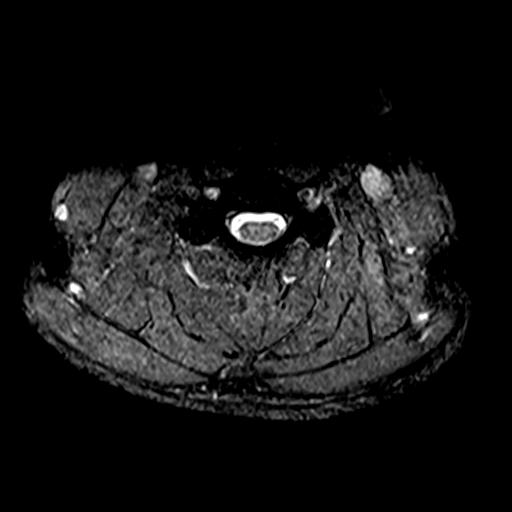
[im 22/33]
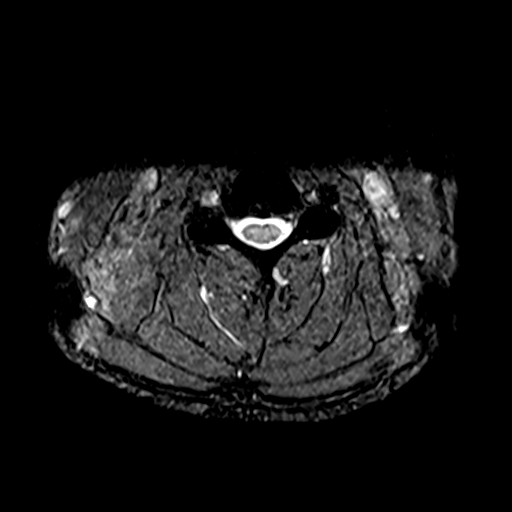
[im 27/33]
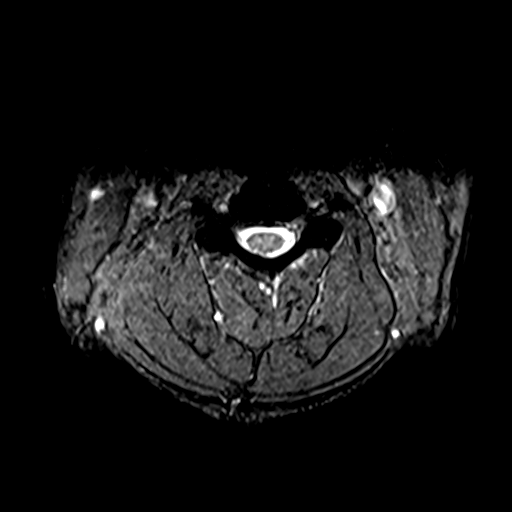
[im 33/33]
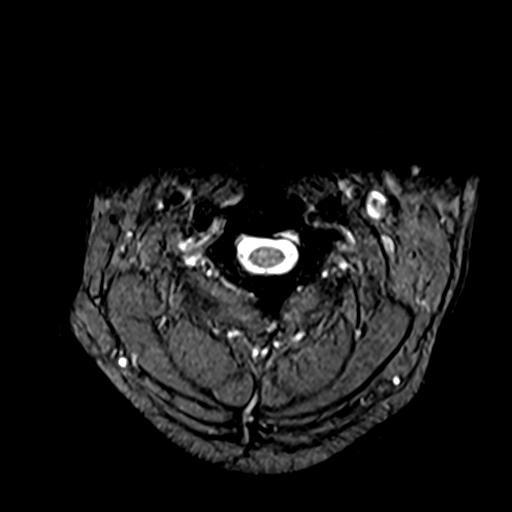

[Series 10: FLAIR · sagittal · 3.0mm · 0.78mm/px · 6 of 15 slices shown (2 of 2)]
[im 1/15]
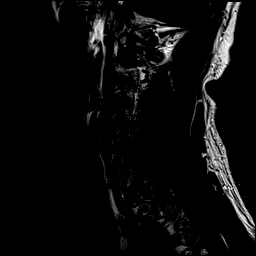
[im 3/15]
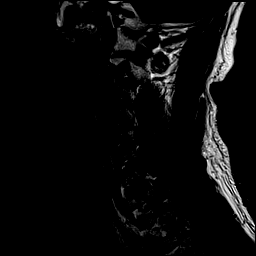
[im 6/15]
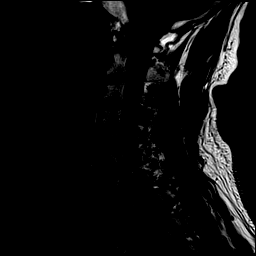
[im 9/15]
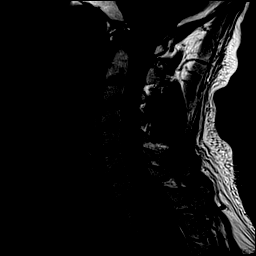
[im 12/15]
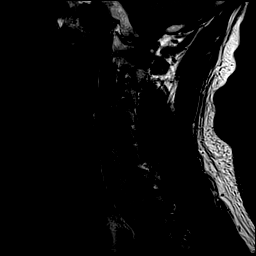
[im 15/15]
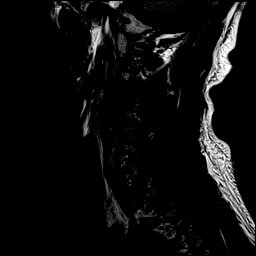

[40 of 48 positions shown; findings below may reference images not displayed]

FINDINGS: Alignment: Normal.

Vertebrae: Vertebral body heights are maintained. No focal marrow
edema to suggest acute fracture.

Cord: Normal cord signal.

Posterior Fossa, vertebral arteries, paraspinal tissues: Visualized
vertebral artery flow voids are maintained. Unremarkable visualized
inferior posterior fossa. No appreciable paraspinal edema.

Disc levels:

C2-C3: No significant disc protrusion, foraminal stenosis, or canal
stenosis.

C3-C4: Small posterior disc osteophyte complex and mild
uncovertebral hypertrophy. Mild left foraminal stenosis without
significant right foraminal stenosis. Mild canal stenosis.

C4-C5: Small posterior disc osteophyte complex with bilateral
facet/uncovertebral hypertrophy. Resulting mild left foraminal
stenosis and mild canal stenosis.

C5-C6: Small posterior disc osteophyte complex and mild left facet
arthropathy without significant canal or foraminal stenosis.

C6-C7: No significant disc protrusion, foraminal stenosis, or canal
stenosis.

C7-T1: Left greater than right facet and uncovertebral hypertrophy
with moderate to severe left and mild-to-moderate right foraminal
stenosis. No significant canal stenosis.
IMPRESSION: 1. At C7-T1, moderate to severe left and mild-to-moderate right
foraminal stenosis.
2. At C4-C5 and C5-C6, mild left foraminal stenosis and mild canal
stenosis.

## 2021-08-03 IMAGING — MR MR MRA HEAD W/O CM
1 series · 19 of 48 positions shown · non-contrast
Comparison: No pertinent prior exam.

CLINICAL DATA: Neuro deficit, acute, stroke suspected

EXAM:
MRI HEAD WITHOUT CONTRAST
MRA HEAD WITHOUT CONTRAST
TECHNIQUE: Multiplanar, multi-echo pulse sequences of the brain and surrounding
structures were acquired without intravenous contrast. Angiographic
images of the Circle of Willis were acquired using MRA technique
without intravenous contrast.

[Series 1: TOF · axial · 0.5mm · 0.41mm/px · z∈[-58,+38]mm · 19 of 205 slices shown]
[im 1/205]
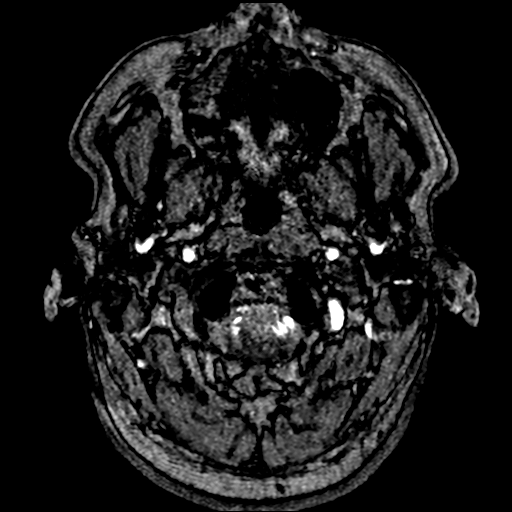
[im 5/205]
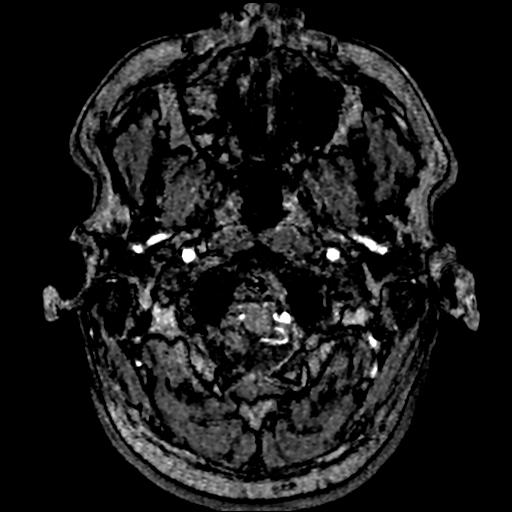
[im 9/205]
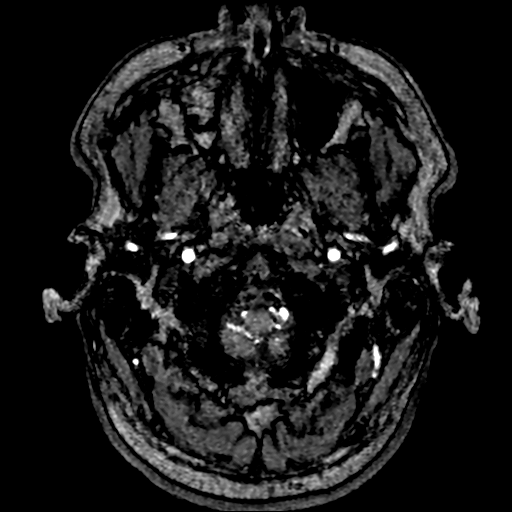
[im 14/205]
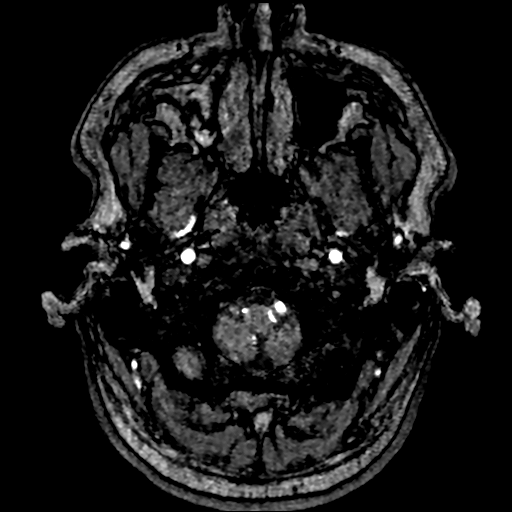
[im 18/205]
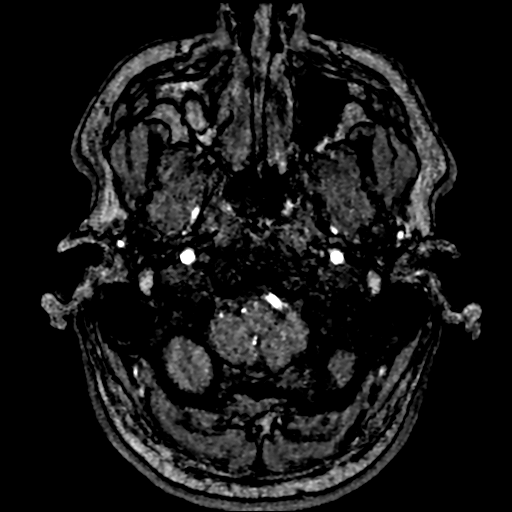
[im 22/205]
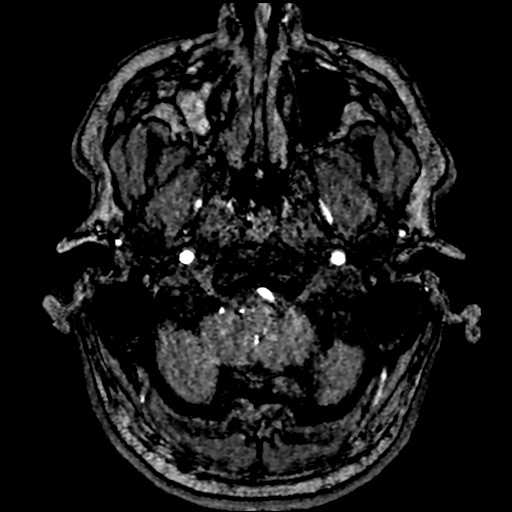
[im 27/205]
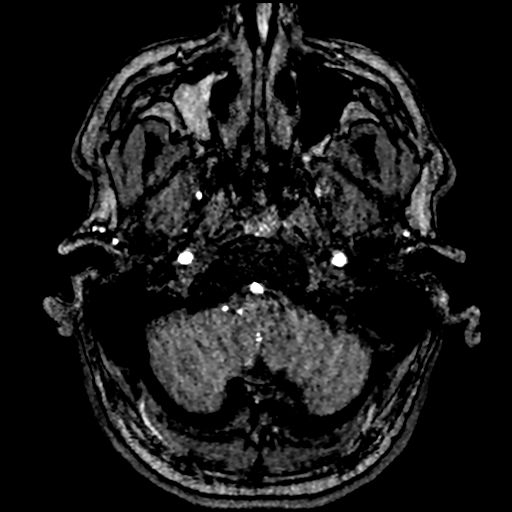
[im 31/205]
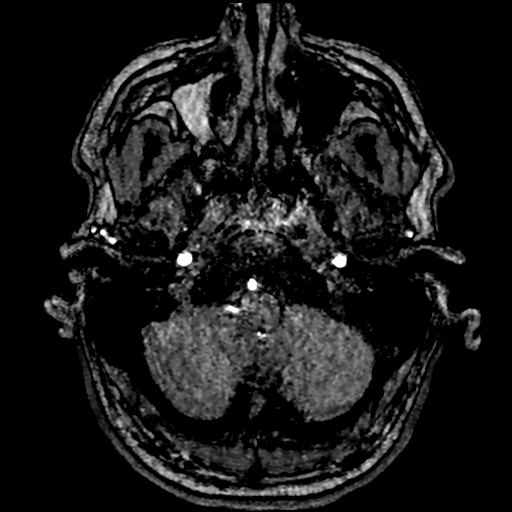
[im 35/205]
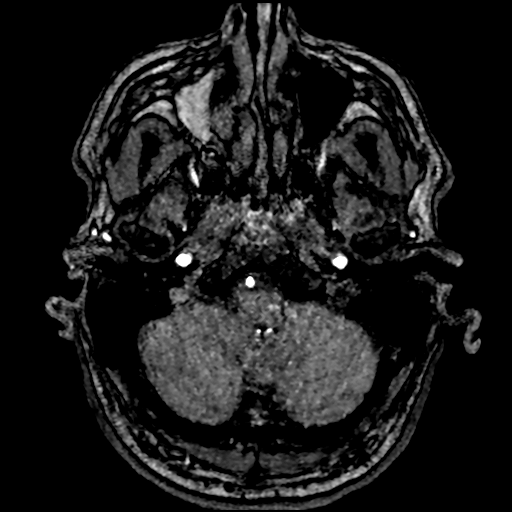
[im 40/205]
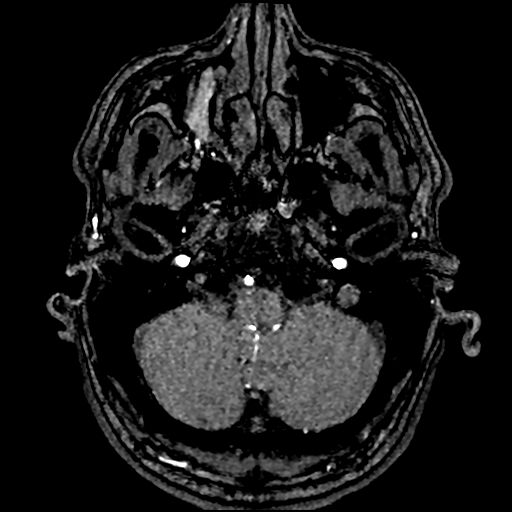
[im 44/205]
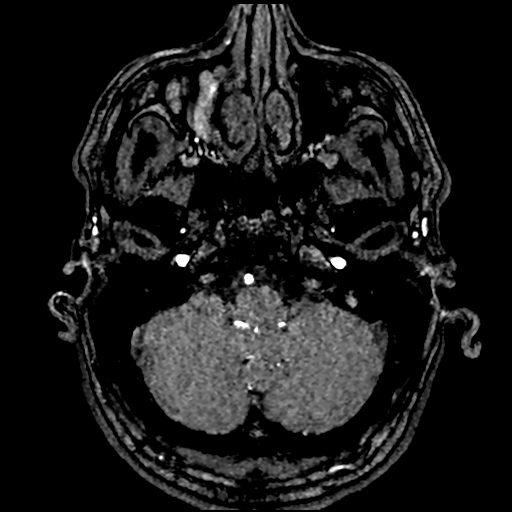
[im 66/205]
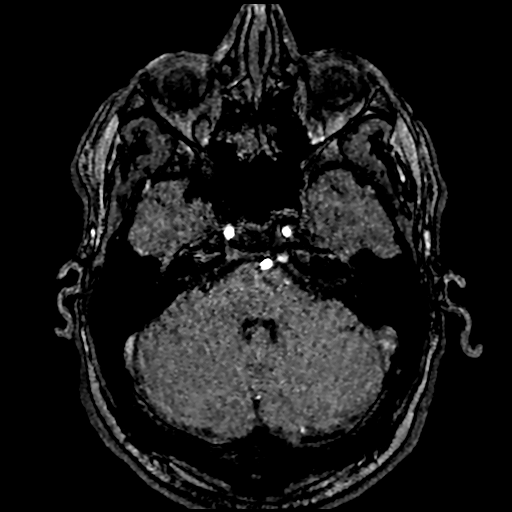
[im 92/205]
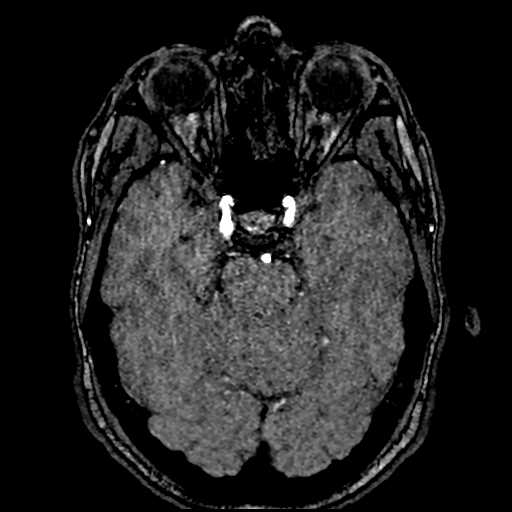
[im 105/205]
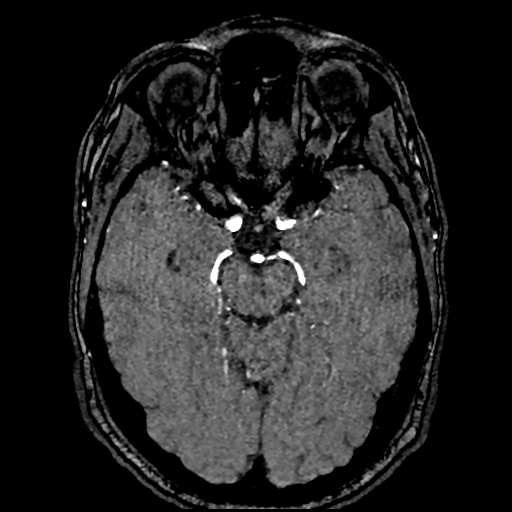
[im 118/205]
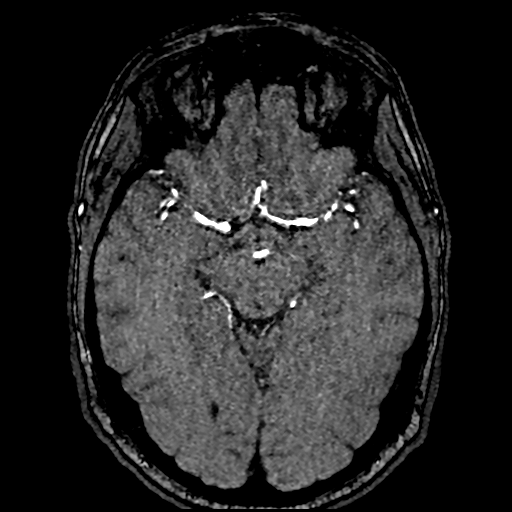
[im 144/205]
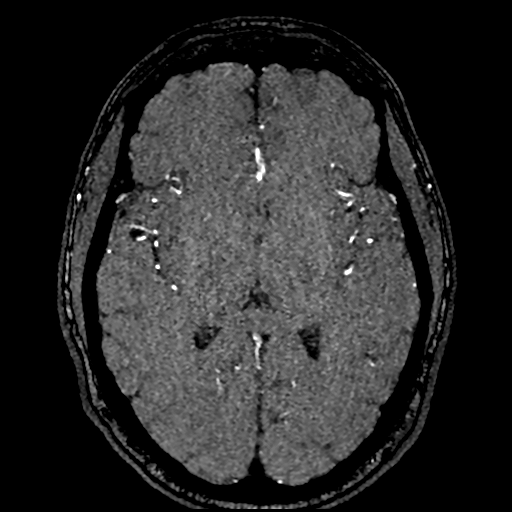
[im 170/205]
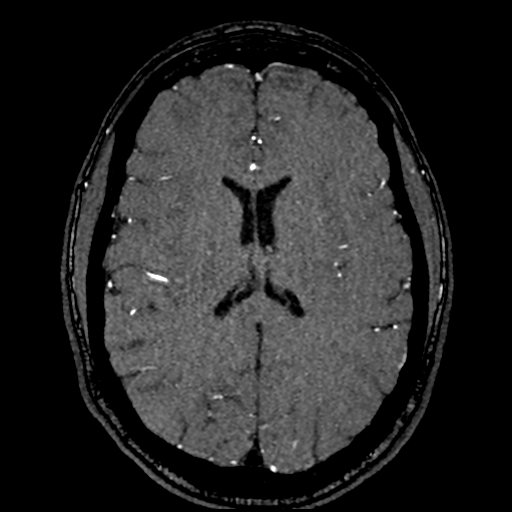
[im 174/205]
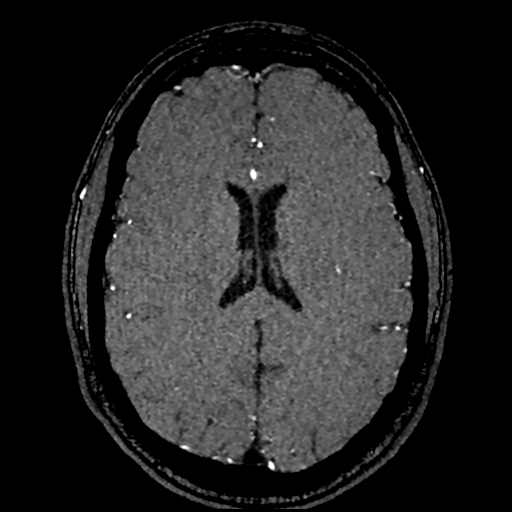
[im 196/205]
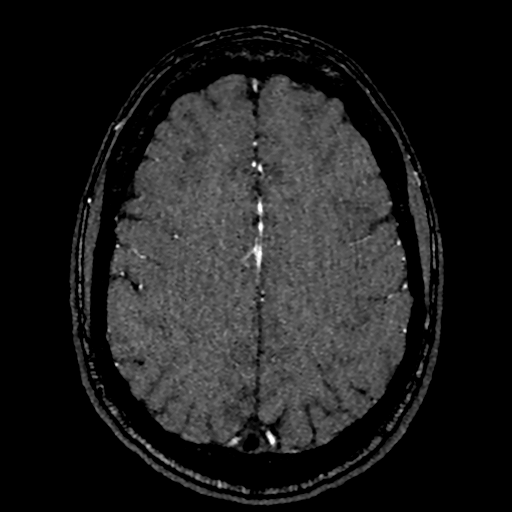

[19 of 48 positions shown; findings below may reference images not displayed]

FINDINGS: MRI HEAD FINDINGS

Brain: There is a 4 mm focus of reduced diffusion at the right
ventral aspect of the superior medulla. No evidence of intracranial
hemorrhage. Ventricles and sulci are normal in size and
configuration. There is no intracranial mass, mass effect,
hydrocephalus, or extra-axial collection. Few patchy foci of T2
hyperintensity in the supratentorial white matter likely reflects
nonspecific gliosis/demyelination.

Vascular: Diminished right vertebral artery flow void.

Skull and upper cervical spine: Normal marrow signal is preserved.

Sinuses/Orbits: Patchy mucosal thickening. Right maxillary sinus is
opacified. Orbits are unremarkable.

Other: Mastoid air cells are clear.

MRA HEAD FINDINGS

Anterior circulation: Intracranial internal carotid arteries are
patent with atherosclerotic irregularity. There is mild stenosis at
the level of the clinoid on the left. Anterior cerebral arteries are
patent. Anterior communicating artery is present. Middle cerebral
arteries are patent with mild atherosclerotic irregularity.

Posterior circulation: Intracranial left vertebral artery is patent.
Proximal right vertebral artery is patent to the PICA origin. There
is no apparent flow beyond this despite vessel being present on the
MRI brain. Basilar artery is patent. Posterior cerebral arteries are
patent.
IMPRESSION: Small acute infarct right ventral medulla.

Intracranial right vertebral artery is patent to the PICA origin.
Beyond this, the vessel appears to be present on MRI brain but there
is no flow visible on MRA.

## 2021-08-03 MED ORDER — INSULIN ASPART 100 UNIT/ML IJ SOLN
0.0000 [IU] | Freq: Three times a day (TID) | INTRAMUSCULAR | Status: DC
  Administered 2021-08-03: 5 [IU] via SUBCUTANEOUS
  Administered 2021-08-03 – 2021-08-04 (×2): 3 [IU] via SUBCUTANEOUS
  Administered 2021-08-04: 12:00:00 7 [IU] via SUBCUTANEOUS
  Filled 2021-08-03 (×4): qty 1

## 2021-08-03 MED ORDER — STROKE: EARLY STAGES OF RECOVERY BOOK: Freq: Once | Status: AC

## 2021-08-03 MED ORDER — ASPIRIN 81 MG PO CHEW
324.0000 mg | CHEWABLE_TABLET | Freq: Once | ORAL | Status: AC
  Administered 2021-08-03: 324 mg via ORAL
  Filled 2021-08-03: qty 4

## 2021-08-03 MED ORDER — INFLUENZA VAC SPLIT QUAD 0.5 ML IM SUSY
0.5000 mL | PREFILLED_SYRINGE | INTRAMUSCULAR | Status: AC
  Administered 2021-08-04: 0.5 mL via INTRAMUSCULAR
  Filled 2021-08-03: qty 0.5

## 2021-08-03 MED ORDER — CLOPIDOGREL BISULFATE 75 MG PO TABS
75.0000 mg | ORAL_TABLET | Freq: Every day | ORAL | Status: DC
  Administered 2021-08-03 – 2021-08-04 (×2): 75 mg via ORAL
  Filled 2021-08-03 (×2): qty 1

## 2021-08-03 MED ORDER — ENOXAPARIN SODIUM 40 MG/0.4ML IJ SOSY
40.0000 mg | PREFILLED_SYRINGE | INTRAMUSCULAR | Status: DC
  Administered 2021-08-03: 22:00:00 40 mg via SUBCUTANEOUS
  Filled 2021-08-03: qty 0.4

## 2021-08-03 MED ORDER — LORAZEPAM 2 MG/ML IJ SOLN
1.0000 mg | INTRAMUSCULAR | Status: DC | PRN
  Administered 2021-08-03: 1 mg via INTRAVENOUS
  Filled 2021-08-03: qty 1

## 2021-08-03 MED ORDER — INSULIN ASPART 100 UNIT/ML IJ SOLN: 0.0000 [IU] | Freq: Every day | INTRAMUSCULAR | Status: DC

## 2021-08-03 MED ORDER — ACETAMINOPHEN 325 MG PO TABS: 650.0000 mg | ORAL_TABLET | ORAL | Status: DC | PRN

## 2021-08-03 MED ORDER — IOHEXOL 350 MG/ML SOLN
75.0000 mL | Freq: Once | INTRAVENOUS | Status: AC | PRN
  Administered 2021-08-03: 75 mL via INTRAVENOUS

## 2021-08-03 MED ORDER — ASPIRIN EC 81 MG PO TBEC
81.0000 mg | DELAYED_RELEASE_TABLET | Freq: Every day | ORAL | Status: DC
  Administered 2021-08-04: 81 mg via ORAL
  Filled 2021-08-03: qty 1

## 2021-08-03 MED ORDER — ONDANSETRON HCL 4 MG/2ML IJ SOLN: 4.0000 mg | Freq: Three times a day (TID) | INTRAMUSCULAR | Status: DC | PRN

## 2021-08-03 MED ORDER — AMITRIPTYLINE HCL 25 MG PO TABS
25.0000 mg | ORAL_TABLET | Freq: Every day | ORAL | Status: DC
  Administered 2021-08-03: 25 mg via ORAL
  Filled 2021-08-03: qty 1

## 2021-08-03 MED ORDER — HYDRALAZINE HCL 20 MG/ML IJ SOLN: 5.0000 mg | INTRAMUSCULAR | Status: DC | PRN

## 2021-08-03 MED ORDER — ACETAMINOPHEN 650 MG RE SUPP: 650.0000 mg | RECTAL | Status: DC | PRN

## 2021-08-03 MED ORDER — ATORVASTATIN CALCIUM 20 MG PO TABS
80.0000 mg | ORAL_TABLET | Freq: Every day | ORAL | Status: DC
  Administered 2021-08-03 – 2021-08-04 (×2): 80 mg via ORAL
  Filled 2021-08-03 (×2): qty 4

## 2021-08-03 MED ORDER — IOHEXOL 350 MG/ML SOLN: 75.0000 mL | Freq: Once | INTRAVENOUS | Status: DC | PRN

## 2021-08-03 MED ORDER — SENNOSIDES-DOCUSATE SODIUM 8.6-50 MG PO TABS: 1.0000 | ORAL_TABLET | Freq: Every evening | ORAL | Status: DC | PRN

## 2021-08-03 MED ORDER — ACETAMINOPHEN 160 MG/5ML PO SOLN
650.0000 mg | ORAL | Status: DC | PRN
  Filled 2021-08-03: qty 20.3

## 2021-08-03 NOTE — Consult Note (Signed)
NEUROLOGY CONSULTATION NOTE   Date of service: August 03, 2021 Patient Name: Jorge Mason MRN:  [id number] DOB:  1971/03/19 Reason for consult: stroke Requesting physician: Dr. 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  His symptoms started on Sunday and involves weakness in his left face, arm, and leg.  He also has numbness and tingling in his left arm.  No dysarthria, dysphagia, or aphasia.  No vision loss.  His symptoms improved slightly on Sunday night but came back on Monday and have been persistent since that time.  He is currently on aspirin 81 mg daily, Plavix 75 mg daily and atorvastatin 80 mg daily.  Stroke work-up this admission:  MRI brain without contrast Small acute infarct right ventral medulla  MRA head no flow visible in the intracranial right vertebral artery after the PICA origin  CNS imaging personally reviewed  CTA neck, TTE, LDL, A1c pending    ROS   Per HPI: all other systems reviewed and are negative  Past History   I have reviewed the following:  Past Medical History:  Diagnosis Date  . Diabetes mellitus without complication ([place])   . Hypertension   . Neuropathy    Past Surgical History:  Procedure Laterality Date  . dental procedure     Family History  Problem Relation Age of Onset  . Stroke Mother   . Stroke Father   . Heart attack Father    Social History   Socioeconomic History  . Marital status: Married    Spouse name: Not on file  . Number of children: Not on file  . Years of education: Not on file  . Highest education level: Not on file  Occupational History  . Not on file  Tobacco Use  . Smoking status: Never  . Smokeless tobacco:  Never  Substance and Sexual Activity  . Alcohol use: Never  . Drug use: Never  . Sexual activity: Not on file  Other Topics Concern  . Not on file  Social History Narrative  . Not on file   Social Determinants of Health   Financial Resource Strain: Not on file  Food Insecurity: Not on file  Transportation Needs: Not on file  Physical Activity: Not on file  Stress: Not on file  Social Connections: Not on file   No Known Allergies  Medications   Medications Prior to Admission  Medication Sig Dispense Refill Last Dose  . amitriptyline (ELAVIL) 25 MG tablet Take 25 mg by mouth at bedtime.   08/02/2021  . cloNIDine (CATAPRES) 0.1 MG tablet Take 0.1 mg by mouth daily as needed.   08/02/2021  . lisinopril (ZESTRIL) 20 MG tablet Take 20 mg by mouth daily.   08/02/2021  . metFORMIN (GLUCOPHAGE-XR) 500 MG 24 hr tablet Take 1,000 mg by mouth 2 (two) times daily.   08/02/2021      Current Facility-Administered Medications:  .   stroke: mapping our early stages of recovery book, , Does not apply, Once, Ivor Costa, MD .  acetaminophen (TYLENOL) tablet 650 mg, 650 mg, Oral, Q4H PRN **OR** acetaminophen (TYLENOL) 160 MG/5ML solution 650 mg, 650 mg, Per  Tube, Q4H PRN **OR** acetaminophen (TYLENOL) suppository 650 mg, 650 mg, Rectal, Q4H PRN, Lorretta Harp, MD .  amitriptyline (ELAVIL) tablet 25 mg, 25 mg, Oral, QHS, Lorretta Harp, MD .  Melene Muller ON 08/04/2021] aspirin EC tablet 81 mg, 81 mg, Oral, Daily, Lorretta Harp, MD .  atorvastatin (LIPITOR) tablet 80 mg, 80 mg, Oral, Daily, Lorretta Harp, MD, 80 mg at 08/03/21 1013 .  clopidogrel (PLAVIX) tablet 75 mg, 75 mg, Oral, Daily, Jefferson Fuel, MD, 75 mg at 08/03/21 1836 .  enoxaparin (LOVENOX) injection 40 mg, 40 mg, Subcutaneous, Q24H, Lorretta Harp, MD .  hydrALAZINE (APRESOLINE) injection 5 mg, 5 mg, Intravenous, Q2H PRN, Lorretta Harp, MD .  Melene Muller ON 08/04/2021] influenza vac split quadrivalent PF (FLUARIX) injection 0.5 mL, 0.5 mL, Intramuscular,  Tomorrow-1000, Niu, Xilin, MD .  insulin aspart (novoLOG) injection 0-5 Units, 0-5 Units, Subcutaneous, QHS, Niu, Xilin, MD .  insulin aspart (novoLOG) injection 0-9 Units, 0-9 Units, Subcutaneous, TID WC, Lorretta Harp, MD, 5 Units at 08/03/21 1836 .  LORazepam (ATIVAN) injection 1 mg, 1 mg, Intravenous, Q4H PRN, Lorretta Harp, MD, 1 mg at 08/03/21 1015 .  ondansetron (ZOFRAN) injection 4 mg, 4 mg, Intravenous, Q8H PRN, Lorretta Harp, MD .  senna-docusate (Senokot-S) tablet 1 tablet, 1 tablet, Oral, QHS PRN, Lorretta Harp, MD  Vitals   Vitals:   08/03/21 1400 08/03/21 1430 08/03/21 1500 08/03/21 1749  BP: (!) 143/87 120/74 134/73 138/87  Pulse: 90 76 82 76  Resp: 15 14 20 16   Temp:    (!) 97.3 F (36.3 C)  TempSrc:    Oral  SpO2: 98% 96% 96% 98%  Weight:      Height:         Body mass index is 29.84 kg/m.  Physical Exam   Physical Exam Gen: A&O x4, NAD HEENT: Atraumatic, normocephalic;mucous membranes moist; oropharynx clear, tongue without atrophy or fasciculations. Neck: Supple, trachea midline. Resp: CTAB, no w/r/r CV: RRR, no m/g/r; nml S1 and S2. 2+ symmetric peripheral pulses. Abd: soft/NT/ND; nabs x 4 quad Extrem: Nml bulk; no cyanosis, clubbing, or edema.  Neuro: *MS: A&O x4. Follows multi-step commands.  *Speech: fluid, nondysarthric, able to name and repeat *CN:    I: Deferred   II,III: PERRLA, VFF by confrontation, optic discs unable to be visualized 2/2 pupillary constriction   III,IV,VI: EOMI w/o nystagmus, no ptosis   V: Sensation intact from V1 to V3 to LT   VII: Eyelid closure was full.  Mild L UMN facial droop   VIII: Hearing intact to voice   IX,X: Voice normal, palate elevates symmetrically    XI: SCM/trap 5/5 bilat   XII: Tongue protrudes midline, no atrophy or fasciculations  *Motor:   Normal bulk.  No tremor, rigidity or bradykinesia. Drift to bed in LUE and LLE    Strength: Dlt Bic Tri WrE WrF FgS Gr HF KnF KnE PlF DoF    Left 3 3 2 3 3 3 3 3 3 3 3 3      Right 5 5 5 5 5 5 5 5 5 5 5 5     *Sensory: Impaired to LT LUE. No double-simultaneous extinction. FNF intact on R, UTA on L 2/2 weakness *Reflexes:  1+ and symmetric throughout without clonus; toes down-going bilat *Gait: deferred  NIHSS  1a Level of Conscious.: 0 1b LOC Questions: 0 1c LOC Commands: 0 2 Best Gaze: 0 3 Visual: 0 4 Facial Palsy: 1 5a Motor Arm - left: 2 5b Motor Arm -  Right: 0 6a Motor Leg - Left: 2 6b Motor Leg - Right: 0 7 Limb Ataxia: 0 8 Sensory: 1 9 Best Language: 0 10 Dysarthria: 0 11 Extinct. and Inatten.: 0  TOTAL: 6   Premorbid mRS = 1   Labs   CBC:  Recent Labs  Lab 08/03/21 0131  WBC 7.6  NEUTROABS 4.1  HGB 14.8  HCT 42.9  MCV 88.8  PLT 99991111    Basic Metabolic Panel:  Lab Results  Component Value Date   NA 135 08/03/2021   K 3.7 08/03/2021   CO2 26 08/03/2021   GLUCOSE 245 (H) 08/03/2021   BUN 16 08/03/2021   CREATININE 1.10 08/03/2021   CALCIUM 9.6 08/03/2021   GFRNONAA >60 08/03/2021   Lipid Panel: No results found for: LDLCALC HgbA1c: No results found for: HGBA1C Urine Drug Screen: No results found for: LABOPIA, COCAINSCRNUR, LABBENZ, AMPHETMU, THCU, LABBARB  Alcohol Level No results found for: ETH   Impression   This is a 50 year old gentleman with history significant for hypertension, hyperlipidemia, diabetes, and peripheral neuropathy who presented with left-sided weakness for 2 days and was found to have a 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    Recommendations   - Permissive HTN x48 hrs until Wed PM goal BP <220/110. PRN labetalol or hydralazine if BP above these parameters. Avoid oral antihypertensives. After Wed PM goal is normotension - strict avoidance of hypotension given severe intracranial stenosis - F/u CTA neck - TTE - Check A1c and LDL + add statin per guidelines - ASA 81mg  daily + plavix 75mg  daily x90 days 2/2 severe intracranial stenosis f/b ASA 81mg  daily after that - q4 hr neuro  checks - STAT head CT for any change in neuro exam - Tele - PT/OT/SLP - Stroke education - Amb referral to neurology upon discharge   Will continue to follow.   ______________________________________________________________________   Thank you for the opportunity to take part in the care of this patient. If you have any further questions, please contact the neurology consultation attending.  Signed,  Su Monks, MD [facility] [phone]  If 7pm- 7am, please page neurology on call as listed in [place].

## 2021-08-03 NOTE — ED Notes (Signed)
Pt given meal tray.

## 2021-08-03 NOTE — ED Provider Notes (Signed)
[facility] Emergency Department Provider Note   ____________________________________________   Event Date/Time   First MD Initiated Contact with Patient 08/03/21 0805     (approximate)  I have reviewed the triage vital signs and the nursing notes.   HISTORY  Chief Complaint Numbness    HPI Jorge Mason is a 50 y.o. male with past medical history of hypertension and diabetes who presents to the ED complaining of numbness and weakness.  Patient reports that he initially developed numbness and tingling in his left arm and left leg 2 days ago, noticed some difficulty walking and using his left leg at the time of onset of symptoms.  He went to bed that night but symptoms seem to be worse in the morning, he began to notice decreased strength in his left arm throughout the day yesterday.  He eventually decided to be seen in the ED once he got off of work last night.  He describes some pain moving down into his left forearm, but he denies any associated neck or back pain.  He has not had any vision changes, speech changes, or facial droop.  He denies any history of similar symptoms in the past, has not had any recent trauma to his head or neck.        Past Medical History:  Diagnosis Date   Diabetes mellitus without complication (HCC)    Hypertension    Neuropathy     There are no problems to display for this patient.   History reviewed. No pertinent surgical history.  Prior to Admission medications   Not on File    Allergies Patient has no known allergies.  History reviewed. No pertinent family history.  Social History Social History   Tobacco Use   Smoking status: Never   Smokeless tobacco: Never  Substance Use Topics   Alcohol use: Never   Drug use: Never    Review of Systems  Constitutional: No fever/chills Eyes: No visual changes. ENT: No sore throat. Cardiovascular: Denies chest pain. Respiratory: Denies shortness of  breath. Gastrointestinal: No abdominal pain.  No nausea, no vomiting.  No diarrhea.  No constipation. Genitourinary: Negative for dysuria. Musculoskeletal: Negative for back pain.  Positive for left forearm pain. Skin: Negative for rash. Neurological: Negative for headaches, positive for left arm and leg numbness and weakness.  ____________________________________________   PHYSICAL EXAM:  VITAL SIGNS: ED Triage Vitals  Enc Vitals Group     BP 08/03/21 0117 (!) 167/99     Pulse Rate 08/03/21 0117 87     Resp 08/03/21 0117 18     Temp 08/03/21 0117 98.1 F (36.7 C)     Temp Source 08/03/21 0117 Oral     SpO2 08/03/21 0117 98 %     Weight 08/03/21 0120 220 lb (99.8 kg)     Height 08/03/21 0120 6' (1.829 m)     Head Circumference --      Peak Flow --      Pain Score 08/03/21 0119 0     Pain Loc --      Pain Edu? --      Excl. in GC? --     Constitutional: Alert and oriented. Eyes: Conjunctivae are normal. Head: Atraumatic. Nose: No congestion/rhinnorhea. Mouth/Throat: Mucous membranes are moist. Neck: Normal ROM Cardiovascular: Normal rate, regular rhythm. Grossly normal heart sounds.  2+ radial pulses bilaterally. Respiratory: Normal respiratory effort.  No retractions. Lungs CTAB. Gastrointestinal: Soft and nontender. No distention. Genitourinary: deferred Musculoskeletal:  No lower extremity tenderness nor edema. Neurologic:  Normal speech and language.  4 out of 5 strength in left upper and lower extremities with associated pronator drift.  No facial droop or cranial nerve deficits noted. Skin:  Skin is warm, dry and intact. No rash noted. Psychiatric: Mood and affect are normal. Speech and behavior are normal.  ____________________________________________   LABS (all labs ordered are listed, but only abnormal results are displayed)  Labs Reviewed  COMPREHENSIVE METABOLIC PANEL - Abnormal; Notable for the following components:      Result Value   Glucose, Bld  245 (*)    Total Protein 8.2 (*)    Total Bilirubin 2.1 (*)    All other components within normal limits  RESP PANEL BY RT-PCR (FLU A&B, COVID) ARPGX2  PROTIME-INR  APTT  CBC  DIFFERENTIAL  URINE DRUG SCREEN, QUALITATIVE ([facility] ONLY)  URINALYSIS, ROUTINE W REFLEX MICROSCOPIC   ____________________________________________  EKG  ED ECG REPORT I, Chesley Noon, the attending physician, personally viewed and interpreted this ECG.   Date: 08/03/2021  EKG Time: 1:38  Rate: 78  Rhythm: normal sinus rhythm  Axis: Normal  Intervals:none  ST&T Change: None   PROCEDURES  Procedure(s) performed (including Critical Care):  Procedures   ____________________________________________   INITIAL IMPRESSION / ASSESSMENT AND PLAN / ED COURSE      50 year old male with past medical history of hypertension and diabetes presents to the ED with 2 days of worsening numbness and weakness affecting his left arm and left leg.  He appears to have weakness affecting both his left arm and leg with significant drift in his left arm but no cranial nerve deficits.  He does have some pain in his left forearm but denies neck or back pain, exam most concerning acute stroke but unfortunately he is outside the window for any acute intervention.  Exam does not appear consistent with a large vessel occlusion and we will hold off on CTA.  We will further assess with MRI brain, also check an MRI of his cervical spine to rule out cervical pathology.  CT head and labs are unremarkable.  Plan to discuss with hospitalist for admission.      ____________________________________________   FINAL CLINICAL IMPRESSION(S) / ED DIAGNOSES  Final diagnoses:  Cerebrovascular accident (CVA), unspecified mechanism [facility])     ED Discharge Orders     None        Note:  This document was prepared using Dragon voice recognition software and may include unintentional dictation errors.    Chesley Noon,  MD 08/03/21 [phone]

## 2021-08-03 NOTE — ED Notes (Signed)
MD informed pt passed Stroke swallow screen

## 2021-08-03 NOTE — ED Notes (Signed)
MRI called. Pt given PRN ativan prior to exam

## 2021-08-03 NOTE — ED Triage Notes (Signed)
Pt presents to ER with left arm numbness and left sided extremity weakness that started Sunday.  Pt noted to have weakness to left arm and left leg.  Pt ambulatory to triage but appeared to be limping.  Pt states numbness is only in left arm.  Pt states he has hx of hypertension.  No hx of strokes.  NIH 4 at this time.  LKW 11/6 at 1400.

## 2021-08-03 NOTE — ED Notes (Signed)
MRI on phone to speak with pt

## 2021-08-03 NOTE — H&P (Addendum)
History and Physical    Jorge Mason [id number] DOB: 04-16-1971 DOA: 08/03/2021  Referring MD/NP/PA:   PCP: Pcp, No   Patient coming from:  The patient is coming from home.  At baseline, pt is independent for most of ADL.        Chief Complaint: Left-sided weakness  HPI: Jorge Mason is a 50 y.o. male with medical history significant of hypertension, hyperlipidemia, peripheral neuropathy, who presents with left-sided weakness.  Patient states that his symptoms started in Sunday.  He states that he has weakness in both left arm and left leg.  He has numbness and tingling in left arm.  No difficulty swallowing or slurred speech. No vision loss or hearing loss.  He states that his symptoms was little better in Sunday night, but comes back Monday, and has been persistent.  He also reports mild pain in left forearm.  No neck pain or neck injury.  Patient does not have chest pain, cough, shortness breath.  No fever or chills.  No nausea vomiting, diarrhea or abdominal pain.  No symptoms of UTI.   ED Course: pt was found to have WBC 7.6, INR 1.0, PTT 30, pending COVID PCR, electrolytes renal function okay, temperature normal, blood pressure 167/99, heart rate 87, RR 15, oxygen saturation 98% on room air.  CT of head is negative for acute intracranial abnormalities.  Patient is placed on MedSurg bed for observation, Dr. Quinn Axe of neurology is consulted.  Review of Systems:   General: no fevers, chills, no body weight gain, has fatigue HEENT: no blurry vision, hearing changes or sore throat Respiratory: no dyspnea, coughing, wheezing CV: no chest pain, no palpitations GI: no nausea, vomiting, abdominal pain, diarrhea, constipation GU: no dysuria, burning on urination, increased urinary frequency, hematuria  Ext: no leg edema Neuro: No vision change or hearing loss.  Has left-sided weakness and left arm numbness Skin: no rash, no skin tear. MSK: No muscle spasm, no deformity, no  limitation of range of movement in spin Heme: No easy bruising.  Travel history: No recent long distant travel.  Allergy: No Known Allergies  Past Medical History:  Diagnosis Date   Diabetes mellitus without complication ([place])    Hypertension    Neuropathy     Past Surgical History:  Procedure Laterality Date   dental procedure      Social History:  reports that he has never smoked. He has never used smokeless tobacco. He reports that he does not drink alcohol and does not use drugs.  Family History:  Family History  Problem Relation Age of Onset   Stroke Mother    Stroke Father    Heart attack Father      Prior to Admission medications   Not on File    Physical Exam: Vitals:   08/03/21 0830 08/03/21 0930 08/03/21 1000 08/03/21 1015  BP: 130/87 (!) 143/89 116/70   Pulse: 66 76 66 70  Resp: 12 20 11 19   Temp:      TempSrc:      SpO2: 98% 98% 96% 98%  Weight:      Height:       General: Not in acute distress HEENT:       Eyes: PERRL, EOMI, no scleral icterus.       ENT: No discharge from the ears and nose, no pharynx injection, no tonsillar enlargement.        Neck: No JVD, no bruit, no mass felt. Heme: No neck lymph node  enlargement. Cardiac: S1/S2, RRR, No murmurs, No gallops or rubs. Respiratory: No rales, wheezing, rhonchi or rubs. GI: Soft, nondistended, nontender, no rebound pain, no organomegaly, BS present. GU: No hematuria Ext: No pitting leg edema bilaterally. 1+DP/PT pulse bilaterally. Musculoskeletal: No joint deformities, No joint redness or warmth, no limitation of ROM in spin. Skin: No rashes.  Neuro: Alert, oriented X3, cranial nerves II-XII grossly intact, moves all extremities normally. Muscle strength 1/5 in left arm and 3/5 in left leg. 5/5 in right extremities, sensation to light touch intact. Brachial reflex 2+ bilaterally. Psych: Patient is not psychotic, no suicidal or hemocidal ideation.  Labs on Admission: I have personally reviewed  following labs and imaging studies  CBC: Recent Labs  Lab 08/03/21 0131  WBC 7.6  NEUTROABS 4.1  HGB 14.8  HCT 42.9  MCV 88.8  PLT 99991111   Basic Metabolic Panel: Recent Labs  Lab 08/03/21 0131  NA 135  K 3.7  CL 99  CO2 26  GLUCOSE 245*  BUN 16  CREATININE 1.10  CALCIUM 9.6   GFR: Estimated Creatinine Clearance: 101.6 mL/min (by C-G formula based on SCr of 1.1 mg/dL). Liver Function Tests: Recent Labs  Lab 08/03/21 0131  AST 23  ALT 24  ALKPHOS 60  BILITOT 2.1*  PROT 8.2*  ALBUMIN 4.3   No results for input(s): LIPASE, AMYLASE in the last 168 hours. No results for input(s): AMMONIA in the last 168 hours. Coagulation Profile: Recent Labs  Lab 08/03/21 0131  INR 1.0   Cardiac Enzymes: No results for input(s): CKTOTAL, CKMB, CKMBINDEX, TROPONINI in the last 168 hours. BNP (last 3 results) No results for input(s): PROBNP in the last 8760 hours. HbA1C: No results for input(s): HGBA1C in the last 72 hours. CBG: No results for input(s): GLUCAP in the last 168 hours. Lipid Profile: No results for input(s): CHOL, HDL, LDLCALC, TRIG, CHOLHDL, LDLDIRECT in the last 72 hours. Thyroid Function Tests: No results for input(s): TSH, T4TOTAL, FREET4, T3FREE, THYROIDAB in the last 72 hours. Anemia Panel: No results for input(s): VITAMINB12, FOLATE, FERRITIN, TIBC, IRON, RETICCTPCT in the last 72 hours. Urine analysis: No results found for: COLORURINE, APPEARANCEUR, LABSPEC, PHURINE, GLUCOSEU, HGBUR, BILIRUBINUR, KETONESUR, PROTEINUR, UROBILINOGEN, NITRITE, LEUKOCYTESUR Sepsis Labs: @LABRCNTIP (procalcitonin:4,lacticidven:4) ) Recent Results (from the past 240 hour(s))  Resp Panel by RT-PCR (Flu A&B, Covid) Nasopharyngeal Swab     Status: None   Collection Time: 08/03/21  9:15 AM   Specimen: Nasopharyngeal Swab; Nasopharyngeal(NP) swabs in vial transport medium  Result Value Ref Range Status   SARS Coronavirus 2 by RT PCR NEGATIVE NEGATIVE Final    Comment:  (NOTE) SARS-CoV-2 target nucleic acids are NOT DETECTED.  The SARS-CoV-2 RNA is generally detectable in upper respiratory specimens during the acute phase of infection. The lowest concentration of SARS-CoV-2 viral copies this assay can detect is 138 copies/mL. A negative result does not preclude SARS-Cov-2 infection and should not be used as the sole basis for treatment or other patient management decisions. A negative result may occur with  improper specimen collection/handling, submission of specimen other than nasopharyngeal swab, presence of viral mutation(s) within the areas targeted by this assay, and inadequate number of viral copies(<138 copies/mL). A negative result must be combined with clinical observations, patient history, and epidemiological information. The expected result is Negative.  Fact Sheet for Patients:  EntrepreneurPulse.com.au  Fact Sheet for Healthcare Providers:  IncredibleEmployment.be  This test is no t yet approved or cleared by the [place] FDA and  has been  authorized for detection and/or diagnosis of SARS-CoV-2 by FDA under an Emergency Use Authorization (EUA). This EUA will remain  in effect (meaning this test can be used) for the duration of the COVID-19 declaration under Section 564(b)(1) of the Act, 21 U.S.C.section 360bbb-3(b)(1), unless the authorization is terminated  or revoked sooner.       Influenza A by PCR NEGATIVE NEGATIVE Final   Influenza B by PCR NEGATIVE NEGATIVE Final    Comment: (NOTE) The Xpert Xpress SARS-CoV-2/FLU/RSV plus assay is intended as an aid in the diagnosis of influenza from Nasopharyngeal swab specimens and should not be used as a sole basis for treatment. Nasal washings and aspirates are unacceptable for Xpert Xpress SARS-CoV-2/FLU/RSV testing.  Fact Sheet for Patients: BloggerCourse.com  Fact Sheet for Healthcare  Providers: SeriousBroker.it  This test is not yet approved or cleared by the [place] FDA and has been authorized for detection and/or diagnosis of SARS-CoV-2 by FDA under an Emergency Use Authorization (EUA). This EUA will remain in effect (meaning this test can be used) for the duration of the COVID-19 declaration under Section 564(b)(1) of the Act, 21 U.S.C. section 360bbb-3(b)(1), unless the authorization is terminated or revoked.  Performed at [facility], [address] [place], [place] [address]      Radiological Exams on Admission: CT HEAD WO CONTRAST  Result Date: 08/03/2021 CLINICAL DATA:  Neurologic deficit. EXAM: CT HEAD WITHOUT CONTRAST TECHNIQUE: Contiguous axial images were obtained from the base of the skull through the vertex without intravenous contrast. COMPARISON:  None. FINDINGS: Brain: The ventricles and sulci are appropriate size for the patient's age. The gray-white matter discrimination is preserved. There is no acute intracranial hemorrhage. No mass effect or midline shift no extra-axial fluid collection. Vascular: No hyperdense vessel or unexpected calcification. Skull: Normal. Negative for fracture or focal lesion. Sinuses/Orbits: Diffuse mucoperiosteal thickening of paranasal sinuses. The mastoid air cells are clear. No air-fluid level. Other: None IMPRESSION: 1. Unremarkable noncontrast CT of the brain. 2. Paranasal sinus disease. Electronically Signed   By: Elgie Collard M.D.   On: 08/03/2021 02:23   MR ANGIO HEAD WO CONTRAST  Result Date: 08/03/2021 CLINICAL DATA:  Neuro deficit, acute, stroke suspected EXAM: MRI HEAD WITHOUT CONTRAST MRA HEAD WITHOUT CONTRAST TECHNIQUE: Multiplanar, multi-echo pulse sequences of the brain and surrounding structures were acquired without intravenous contrast. Angiographic images of the Circle of Willis were acquired using MRA technique without intravenous contrast. COMPARISON:  No  pertinent prior exam. FINDINGS: MRI HEAD FINDINGS Brain: There is a 4 mm focus of reduced diffusion at the right ventral aspect of the superior medulla. No evidence of intracranial hemorrhage. Ventricles and sulci are normal in size and configuration. There is no intracranial mass, mass effect, hydrocephalus, or extra-axial collection. Few patchy foci of T2 hyperintensity in the supratentorial white matter likely reflects nonspecific gliosis/demyelination. Vascular: Diminished right vertebral artery flow void. Skull and upper cervical spine: Normal marrow signal is preserved. Sinuses/Orbits: Patchy mucosal thickening. Right maxillary sinus is opacified. Orbits are unremarkable. Other: Mastoid air cells are clear. MRA HEAD FINDINGS Anterior circulation: Intracranial internal carotid arteries are patent with atherosclerotic irregularity. There is mild stenosis at the level of the clinoid on the left. Anterior cerebral arteries are patent. Anterior communicating artery is present. Middle cerebral arteries are patent with mild atherosclerotic irregularity. Posterior circulation: Intracranial left vertebral artery is patent. Proximal right vertebral artery is patent to the PICA origin. There is no apparent flow beyond this despite vessel being present on the  MRI brain. Basilar artery is patent. Posterior cerebral arteries are patent. IMPRESSION: Small acute infarct right ventral medulla. Intracranial right vertebral artery is patent to the PICA origin. Beyond this, the vessel appears to be present on MRI brain but there is no flow visible on MRA. Electronically Signed   By: Macy Mis M.D.   On: 08/03/2021 11:47   MR BRAIN WO CONTRAST  Result Date: 08/03/2021 CLINICAL DATA:  Neuro deficit, acute, stroke suspected EXAM: MRI HEAD WITHOUT CONTRAST MRA HEAD WITHOUT CONTRAST TECHNIQUE: Multiplanar, multi-echo pulse sequences of the brain and surrounding structures were acquired without intravenous contrast.  Angiographic images of the Circle of Willis were acquired using MRA technique without intravenous contrast. COMPARISON:  No pertinent prior exam. FINDINGS: MRI HEAD FINDINGS Brain: There is a 4 mm focus of reduced diffusion at the right ventral aspect of the superior medulla. No evidence of intracranial hemorrhage. Ventricles and sulci are normal in size and configuration. There is no intracranial mass, mass effect, hydrocephalus, or extra-axial collection. Few patchy foci of T2 hyperintensity in the supratentorial white matter likely reflects nonspecific gliosis/demyelination. Vascular: Diminished right vertebral artery flow void. Skull and upper cervical spine: Normal marrow signal is preserved. Sinuses/Orbits: Patchy mucosal thickening. Right maxillary sinus is opacified. Orbits are unremarkable. Other: Mastoid air cells are clear. MRA HEAD FINDINGS Anterior circulation: Intracranial internal carotid arteries are patent with atherosclerotic irregularity. There is mild stenosis at the level of the clinoid on the left. Anterior cerebral arteries are patent. Anterior communicating artery is present. Middle cerebral arteries are patent with mild atherosclerotic irregularity. Posterior circulation: Intracranial left vertebral artery is patent. Proximal right vertebral artery is patent to the PICA origin. There is no apparent flow beyond this despite vessel being present on the MRI brain. Basilar artery is patent. Posterior cerebral arteries are patent. IMPRESSION: Small acute infarct right ventral medulla. Intracranial right vertebral artery is patent to the PICA origin. Beyond this, the vessel appears to be present on MRI brain but there is no flow visible on MRA. Electronically Signed   By: Macy Mis M.D.   On: 08/03/2021 11:47   MR Cervical Spine Wo Contrast  Result Date: 08/03/2021 CLINICAL DATA:  Spinal stenosis, C-spine. Left arm numbness and left sided extremity weakness that started Sunday. EXAM: MRI  CERVICAL SPINE WITHOUT CONTRAST TECHNIQUE: Multiplanar, multisequence MR imaging of the cervical spine was performed. No intravenous contrast was administered. COMPARISON:  None. FINDINGS: Alignment: Normal. Vertebrae: Vertebral body heights are maintained. No focal marrow edema to suggest acute fracture. Cord: Normal cord signal. Posterior Fossa, vertebral arteries, paraspinal tissues: Visualized vertebral artery flow voids are maintained. Unremarkable visualized inferior posterior fossa. No appreciable paraspinal edema. Disc levels: C2-C3: No significant disc protrusion, foraminal stenosis, or canal stenosis. C3-C4: Small posterior disc osteophyte complex and mild uncovertebral hypertrophy. Mild left foraminal stenosis without significant right foraminal stenosis. Mild canal stenosis. C4-C5: Small posterior disc osteophyte complex with bilateral facet/uncovertebral hypertrophy. Resulting mild left foraminal stenosis and mild canal stenosis. C5-C6: Small posterior disc osteophyte complex and mild left facet arthropathy without significant canal or foraminal stenosis. C6-C7: No significant disc protrusion, foraminal stenosis, or canal stenosis. C7-T1: Left greater than right facet and uncovertebral hypertrophy with moderate to severe left and mild-to-moderate right foraminal stenosis. No significant canal stenosis. IMPRESSION: 1. At C7-T1, moderate to severe left and mild-to-moderate right foraminal stenosis. 2. At C4-C5 and C5-C6, mild left foraminal stenosis and mild canal stenosis. Electronically Signed   By: Margaretha Sheffield M.D.   On:  08/03/2021 11:56     EKG: I have personally reviewed.  Sinus rhythm, QTC 405, LAE, nonspecific T wave change  Principal Problem:   Stroke [facility]) Active Problems:   Left-sided weakness   Hypertension   Diabetes mellitus without complication ([place])   Neuropathy   Stroke: pt has persistent left-sided weakness, highly suspect stroke.  CT head is negative for acute  intracranial abnormality. Paient also has some pain in the left forearm, will get an MRI of brain and an MRI of neck. Dr.Stack of neuro is consulted.  - placed on MedSurg bed for observation - Obtain MRI of brain, MRA of head and MRI-neck - will hold oral Bp meds to allow permissive HTN in the setting of acute stroke,  - start ASA and lipitor - fasting lipid panel and HbA1c  - 2D transthoracic echocardiography  - swallowing screen. If fails, will get SLP - Check UDS  - PT/OT consult  Addendum: MRI-brain and MRA of head showed: Small acute infarct right ventral medulla.   Intracranial right vertebral artery is patent to the PICA origin. Beyond this, the vessel appears to be present on MRI brain but there is no flow visible on MRA.   MRI-c spin showed: 1. At C7-T1, moderate to severe left and mild-to-moderate right foraminal stenosis. 2. At C4-C5 and C5-C6, mild left foraminal stenosis and mild canal stenosis.  Hypertension -hold home blood pressure medications -IV hydralazine as needed for SBP>220 or dBP>120  Diabetes mellitus without complication [facility]): Patient is taking metformin at home.  Recent A1c 9.7, poorly controlled. -Sliding scale insulin -Follow-up A1c  Neuropathy: Likely diabetic peripheral neuropathy. -Continue home amitriptyline     DVT ppx:  SQ Lovenox Code Status: Full code Family Communication: Yes, patient's fianc by phone Disposition Plan:  Anticipate discharge back to previous environment Consults called:  Dr. Quinn Axe of neuro Admission status and  Level of care: Med-Surg:    Med-surg bed for obs   Status is: Observation  The patient remains OBS appropriate and will d/c before 2 midnights.         Date of Service 08/03/2021    Ivor Costa [facility]   If 7PM-7AM, please contact night-coverage [email or web address] 08/03/2021, 1:04 PM

## 2021-08-04 ENCOUNTER — Observation Stay: Payer: Self-pay

## 2021-08-04 ENCOUNTER — Observation Stay (HOSPITAL_BASED_OUTPATIENT_CLINIC_OR_DEPARTMENT_OTHER)
Admit: 2021-08-04 | Discharge: 2021-08-04 | Disposition: A | Discharge status: Home / Self Care | Payer: Self-pay | Attending: Internal Medicine | Admitting: Internal Medicine

## 2021-08-04 DIAGNOSIS — I6389 Other cerebral infarction: Secondary | ICD-10-CM

## 2021-08-04 DIAGNOSIS — R531 Weakness: Secondary | ICD-10-CM

## 2021-08-04 LAB — ECHOCARDIOGRAM COMPLETE
AR max vel: 2.99 cm2
AV Area VTI: 2.73 cm2
AV Area mean vel: 2.42 cm2
AV Mean grad: 1 mmHg
AV Peak grad: 2 mmHg
Ao pk vel: 0.7 m/s
Area-P 1/2: 3.19 cm2
Height: 72 in
MV VTI: 2.38 cm2
S' Lateral: 2.52 cm
Weight: 3520 oz

## 2021-08-04 LAB — GLUCOSE, CAPILLARY
Glucose-Capillary: 222 mg/dL — ABNORMAL HIGH (ref 70–99)
Glucose-Capillary: 324 mg/dL — ABNORMAL HIGH (ref 70–99)
Glucose-Capillary: 198 mg/dL — ABNORMAL HIGH (ref 70–99)

## 2021-08-04 LAB — LIPID PANEL
Cholesterol: 204 mg/dL — ABNORMAL HIGH (ref 0–200)
HDL: 41 mg/dL (ref >40)
LDL Cholesterol: UNDETERMINED mg/dL (ref 0–99)
Total CHOL/HDL Ratio: 5 RATIO
Triglycerides: 407 mg/dL — ABNORMAL HIGH (ref <150)
VLDL: UNDETERMINED mg/dL (ref 0–40)

## 2021-08-04 LAB — HEMOGLOBIN A1C
Hgb A1c MFr Bld: 9.5 % — ABNORMAL HIGH (ref 4.8–5.6)
Mean Plasma Glucose: 225.95 mg/dL

## 2021-08-04 LAB — LDL CHOLESTEROL, DIRECT: Direct LDL: 96 mg/dL (ref 0–99)

## 2021-08-04 LAB — HIV ANTIBODY (ROUTINE TESTING W REFLEX): HIV Screen 4th Generation wRfx: NONREACTIVE

## 2021-08-04 IMAGING — US US EXTREM LOW VENOUS
1 series · 13 of 24 positions shown · non-contrast
Comparison: None.

CLINICAL DATA: Bilateral lower extremity swelling, left side
greater than right.



[Series 1: us venous img lower bilat (dvt) · portal-venous · 13 of 61 slices shown]
[im 1/61]
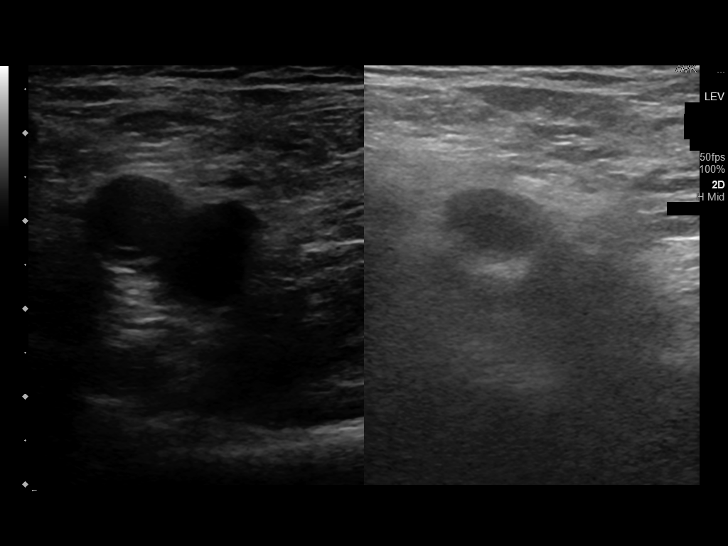
[im 6/61]
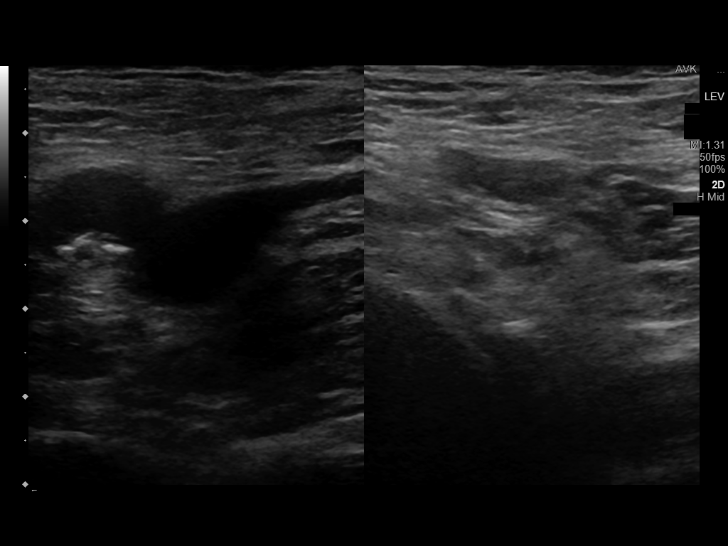
[im 11/61]
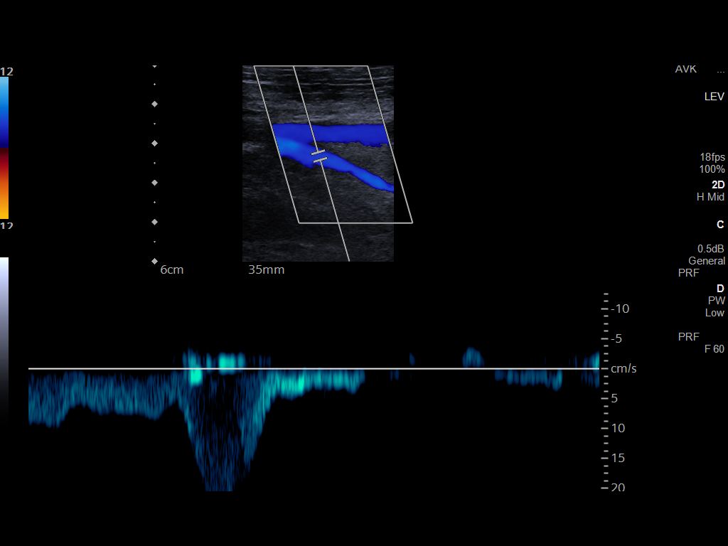
[im 16/61]
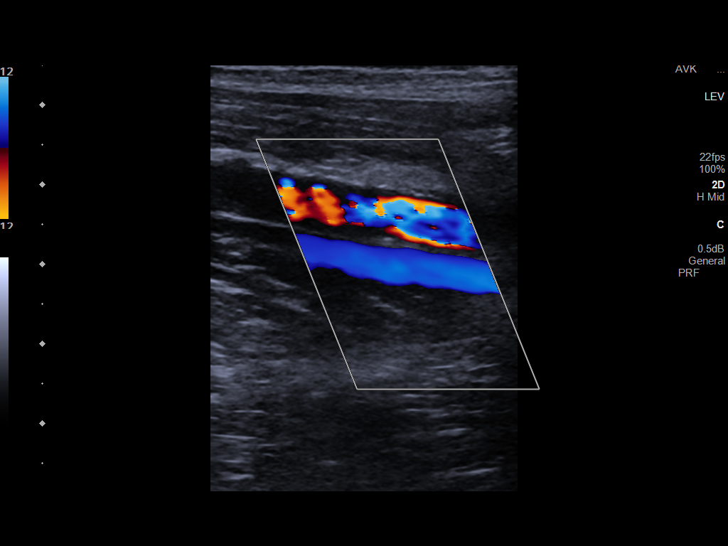
[im 21/61]
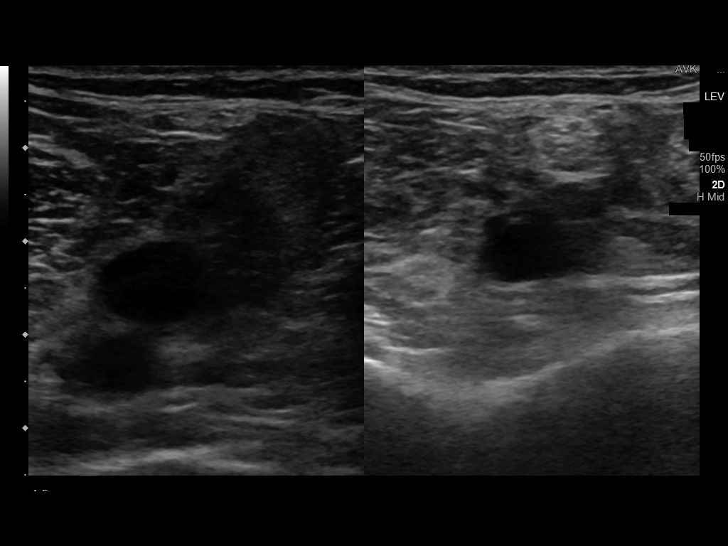
[im 27/61]
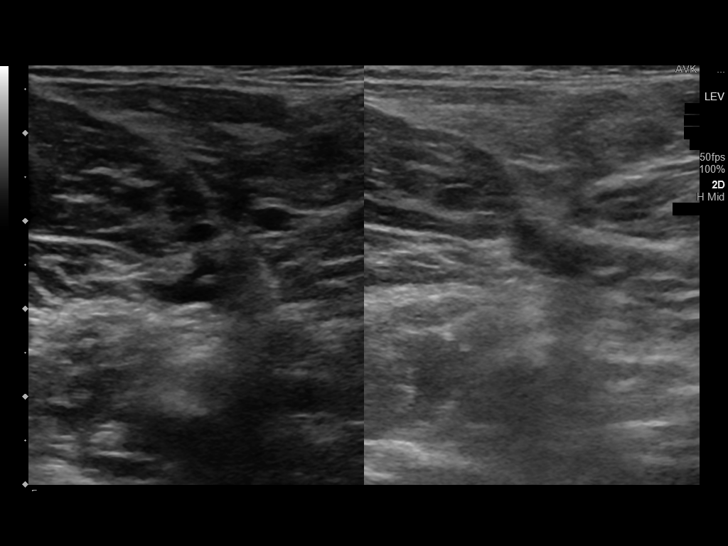
[im 32/61]
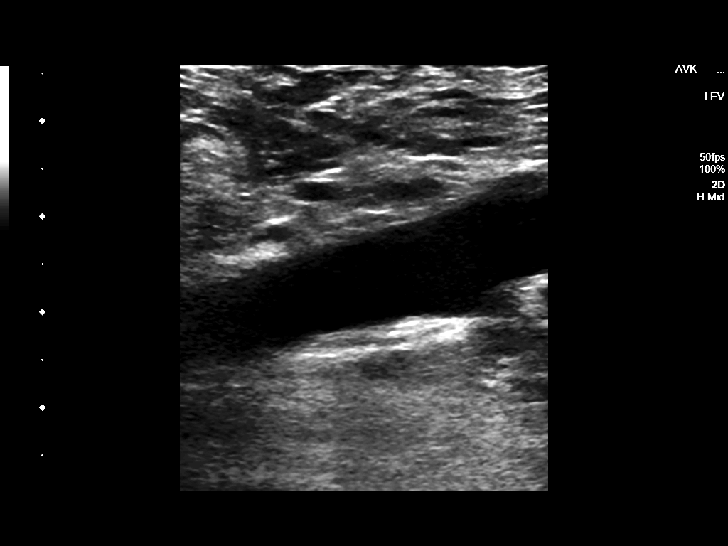
[im 34/61]
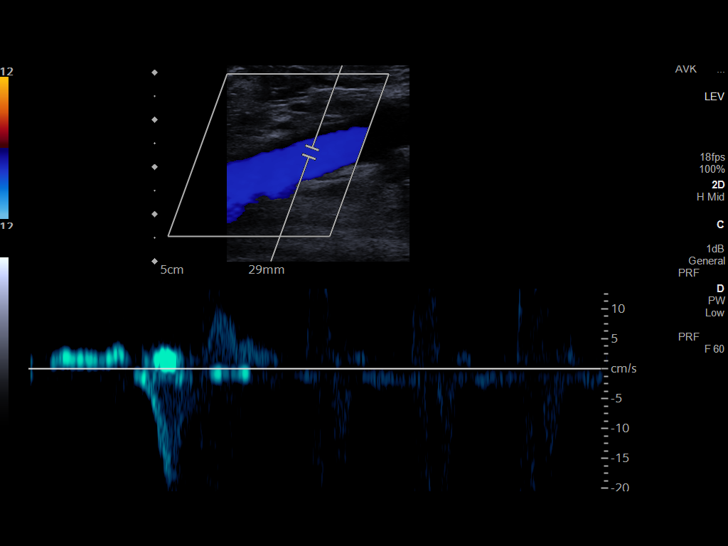
[im 40/61]
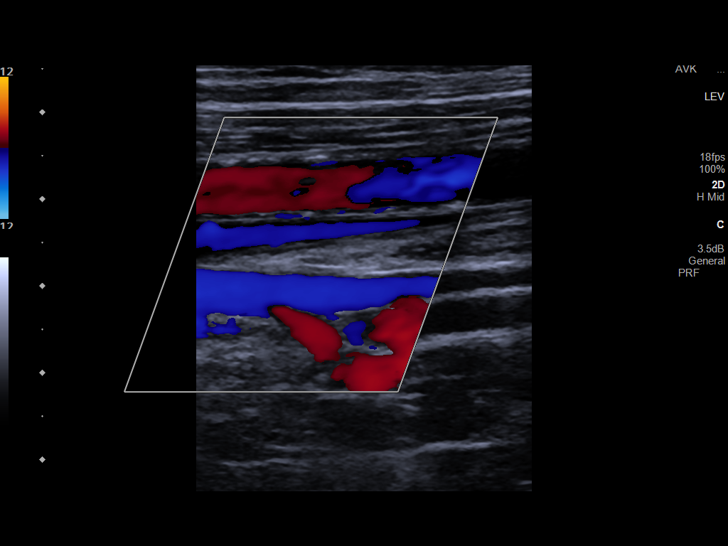
[im 45/61]
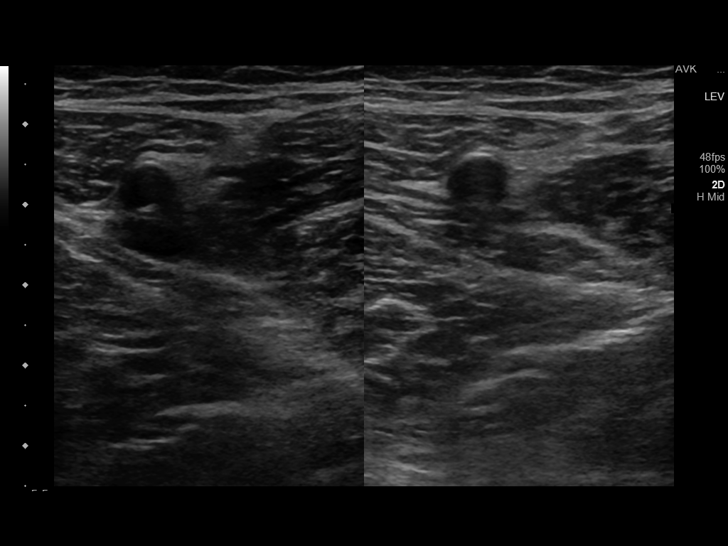
[im 50/61]
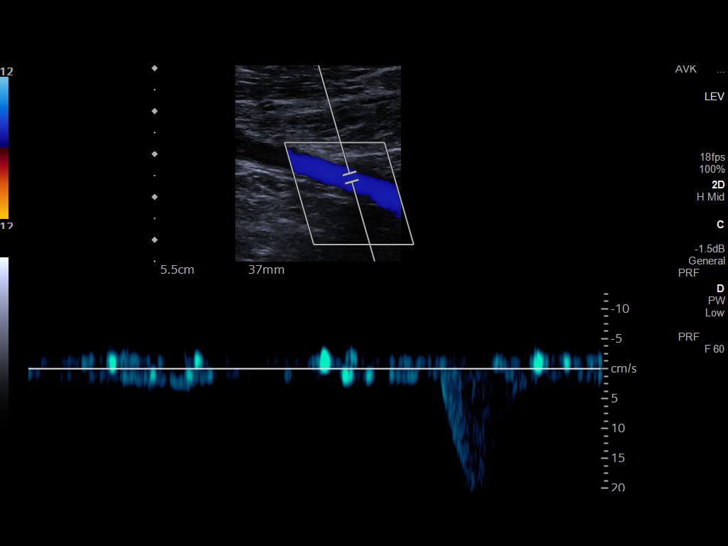
[im 55/61]
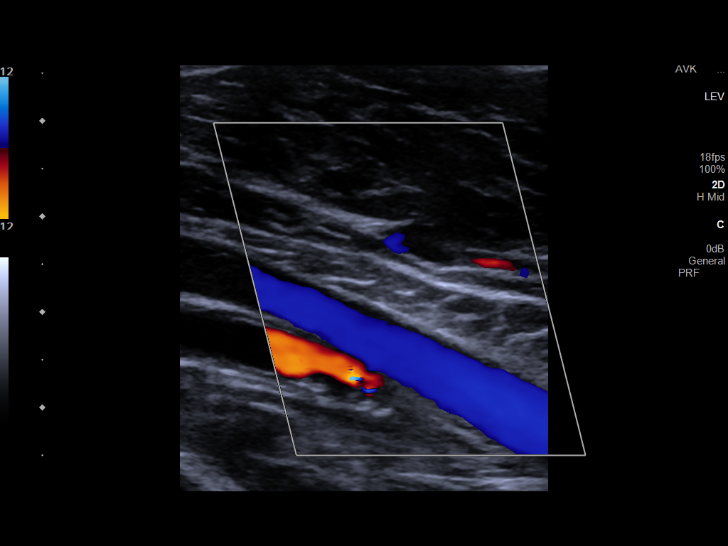
[im 61/61]
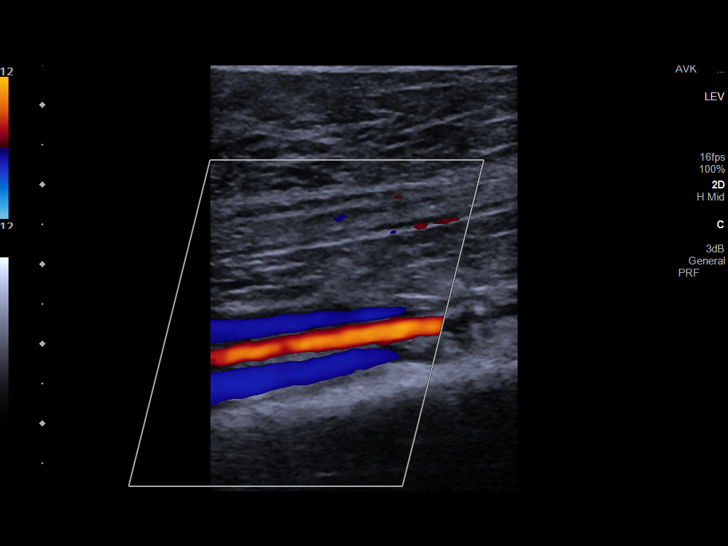

[13 of 24 positions shown; findings below may reference images not displayed]

FINDINGS: RIGHT LOWER EXTREMITY

Common Femoral Vein: No evidence of thrombus. Normal
compressibility, respiratory phasicity and response to augmentation.

Saphenofemoral Junction: No evidence of thrombus. Normal
compressibility and flow on color Doppler imaging.

Profunda Femoral Vein: No evidence of thrombus. Normal
compressibility and flow on color Doppler imaging.

Femoral Vein: No evidence of thrombus. Normal compressibility,
respiratory phasicity and response to augmentation.

Popliteal Vein: No evidence of thrombus. Normal compressibility,
respiratory phasicity and response to augmentation.

Calf Veins: No evidence of thrombus. Normal compressibility and flow
on color Doppler imaging.

LEFT LOWER EXTREMITY

Common Femoral Vein: No evidence of thrombus. Normal
compressibility, respiratory phasicity and response to augmentation.

Saphenofemoral Junction: No evidence of thrombus. Normal
compressibility and flow on color Doppler imaging.

Profunda Femoral Vein: No evidence of thrombus. Normal
compressibility and flow on color Doppler imaging.

Femoral Vein: No evidence of thrombus. Normal compressibility,
respiratory phasicity and response to augmentation.

Popliteal Vein: No evidence of thrombus. Normal compressibility,
respiratory phasicity and response to augmentation.

Calf Veins: No evidence of thrombus. Normal compressibility and flow
on color Doppler imaging.

Other Findings:  None.
IMPRESSION: No evidence of deep venous thrombosis in either lower extremity.

## 2021-08-04 MED ORDER — ASPIRIN 81 MG PO TBEC: 81.0000 mg | DELAYED_RELEASE_TABLET | Freq: Every day | ORAL | 0 refills | Status: AC

## 2021-08-04 MED ORDER — CLOPIDOGREL BISULFATE 75 MG PO TABS: 75.0000 mg | ORAL_TABLET | Freq: Every day | ORAL | 0 refills | Status: AC

## 2021-08-04 MED ORDER — ATORVASTATIN CALCIUM 80 MG PO TABS: 80.0000 mg | ORAL_TABLET | Freq: Every day | ORAL | 1 refills | Status: DC

## 2021-08-04 NOTE — Progress Notes (Signed)
*  PRELIMINARY RESULTS* Echocardiogram 2D Echocardiogram has been performed.  Jorge Mason 08/04/2021, 9:38 AM

## 2021-08-04 NOTE — Progress Notes (Addendum)
Inpatient Diabetes Program Recommendations  AACE/ADA: New Consensus Statement on Inpatient Glycemic Control (2015)  Target Ranges:  Prepandial:   less than 140 mg/dL      Peak postprandial:   less than 180 mg/dL (1-2 hours)      Critically ill patients:  140 - 180 mg/dL  Results for DALBERT, STILLINGS (MRN [id number]) as of 08/04/2021 13:49  Ref. Range 08/03/2021 13:37 08/03/2021 18:04 08/03/2021 20:32  Glucose-Capillary Latest Ref Range: 70 - 99 mg/dL 937 (H)  3 units Novolog  254 (H)  5 units Novolog  187 (H)  Results for AARISH, ROCKERS (MRN [id number]) as of 08/04/2021 13:49  Ref. Range 08/04/2021 08:01 08/04/2021 11:18 08/04/2021 13:37  Glucose-Capillary Latest Ref Range: 70 - 99 mg/dL 101 (H)  3 units Novolog  324 (H)  7 units Novolog  198 (H)    Admit with: CVA  History: DM2  Home DM Meds: Metformin 1000 mg BID  Current Orders: Novolog Sensitive Correction Scale/ SSI (0-9 units) TID AC + HS   MD- Note home Metformin on hold.  CBGs elevated.  Please consider:  1. Start Semglee 9 units QHS (0.1 units/kg)  2. Start Novolog Meal Coverage: Novolog 4 units TID with meals Hold if pt eats <50% of meal, Hold if pt NPO   --Will follow patient during hospitalization--  Ambrose Finland RN, MSN, CDE Diabetes Coordinator Inpatient Glycemic Control Team Team Pager: [phone] (8a-5p)

## 2021-08-04 NOTE — Evaluation (Signed)
Physical Therapy Evaluation Patient Details Name: Jorge Mason MRN: [id number] DOB: 01/12/1971 Today's Date: 08/04/2021  History of Present Illness  [name] is a 50 y.o. male with medical history significant of hypertension, hyperlipidemia, peripheral neuropathy, who presents with left-sided weakness.     Patient states that his symptoms started in Sunday.  He states that he has weakness in both left arm and left leg.  He has numbness and tingling in left arm.  No difficulty swallowing or slurred speech. No vision loss or hearing loss.  He states that his symptoms was little better in Sunday night, but comes back Monday, and has been persistent.  He also reports mild pain in left forearm.  No neck pain or neck injury.  Patient does not have chest pain, cough, shortness breath.  No fever or chills.  No nausea vomiting, diarrhea or abdominal pain.  No symptoms of UTI. 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   Clinical Impression  Patient received in recliner. He reports he is having increased weakness in L UE vs LE. He does have weakness in L LE with hyperextension with ambulation. Transfers with supervision/independently and ambulated with SPC 150 feet with supervision to contact guard. Cues needed for sequencing when using SPC. Patient will continue to benefit from skilled PT while here to improve strength, and safety for return home.       Recommendations for follow up therapy are one component of a multi-disciplinary discharge planning process, led by the attending physician.  Recommendations may be updated based on patient status, additional functional criteria and insurance authorization.  Follow Up Recommendations Outpatient PT    Assistance Recommended at Discharge None  Functional Status Assessment Patient has had a recent decline in their functional status and demonstrates the ability to make significant improvements in function in a reasonable and predictable amount  of time.  Nurse, learning disability    Recommendations for Other Services       Precautions / Restrictions Precautions Precaution Comments: low fall Restrictions Weight Bearing Restrictions: No      Mobility  Bed Mobility               General bed mobility comments: patient received in recliner    Transfers Overall transfer level: Independent                      Ambulation/Gait Ambulation/Gait assistance: Supervision Gait Distance (Feet): 150 Feet Assistive device: Straight cane Gait Pattern/deviations: Step-through pattern Gait velocity: decr     General Gait Details: some hyperextension on left with weight bearing, cues for sequencing with [facility]  Stairs            Wheelchair Mobility    Modified Rankin (Stroke Patients Only) Modified Rankin (Stroke Patients Only) Pre-Morbid Rankin Score: No symptoms Modified Rankin: Moderate disability     Balance Overall balance assessment: Modified Independent                                           Pertinent Vitals/Pain Pain Assessment: No/denies pain    Home Living Family/patient expects to be discharged to:: Private residence Living Arrangements: Spouse/significant other Available Help at Discharge: Friend(s);Available PRN/intermittently;Family;Available 24 hours/day Type of Home: Mobile home Home Access: Ramped entrance       Home Layout: One level Home Equipment: Hand held shower head;Grab bars - tub/shower;Shower  seat - built in Additional Comments: fiancee has CP - pt assists with transfers, IADLs, and bathing.    Prior Function Prior Level of Function : Independent/Modified Independent;Driving             Mobility Comments: independent, working, is a Technical sales engineer ADLs Comments: independent     Hand Dominance   Dominant Hand: Right    Extremity/Trunk Assessment   Upper Extremity Assessment Upper Extremity Assessment: Defer to OT evaluation     Lower Extremity Assessment Lower Extremity Assessment: LLE deficits/detail LLE Coordination: decreased gross motor    Cervical / Trunk Assessment Cervical / Trunk Assessment: Normal  Communication   Communication: No difficulties  Cognition Arousal/Alertness: Awake/alert Behavior During Therapy: WFL for tasks assessed/performed Overall Cognitive Status: Within Functional Limits for tasks assessed                                          General Comments      Exercises     Assessment/Plan    PT Assessment Patient needs continued PT services  PT Problem List Decreased strength;Decreased mobility;Decreased activity tolerance;Decreased balance;Decreased range of motion       PT Treatment Interventions DME instruction;Therapeutic activities;Therapeutic exercise;Gait training;Stair training;Functional mobility training;Balance training;Neuromuscular re-education;Patient/family education    PT Goals (Current goals can be found in the Care Plan section)  Acute Rehab PT Goals Patient Stated Goal: to improve PT Goal Formulation: With patient Time For Goal Achievement: 08/11/21 Potential to Achieve Goals: Good    Frequency Min 2X/week   Barriers to discharge        Co-evaluation               AM-PAC PT "6 Clicks" Mobility  Outcome Measure Help needed turning from your back to your side while in a flat bed without using bedrails?: None Help needed moving from lying on your back to sitting on the side of a flat bed without using bedrails?: None Help needed moving to and from a bed to a chair (including a wheelchair)?: None Help needed standing up from a chair using your arms (e.g., wheelchair or bedside chair)?: None Help needed to walk in hospital room?: A Little Help needed climbing 3-5 steps with a railing? : A Little 6 Click Score: 22    End of Session   Activity Tolerance: Patient tolerated treatment well Patient left: in chair;with call  bell/phone within reach;with chair alarm set Nurse Communication: Mobility status PT Visit Diagnosis: Other abnormalities of gait and mobility (R26.89);Muscle weakness (generalized) (M62.81);Hemiplegia and hemiparesis Hemiplegia - Right/Left: Left Hemiplegia - dominant/non-dominant: Non-dominant Hemiplegia - caused by: Cerebral infarction    Time: 9924-2683 PT Time Calculation (min) (ACUTE ONLY): 19 min   Charges:   PT Evaluation $PT Eval Moderate Complexity: 1 Mod          Edsel Shives, PT, GCS 08/04/21,12:48 PM

## 2021-08-04 NOTE — Progress Notes (Signed)
SLP Cancellation Note  Patient Details Name: Jorge Mason MRN: [id number] DOB: 06/08/1971   Cancelled treatment:       Reason Eval/Treat Not Completed: SLP screened, no needs identified, will sign off. Patient reports at baseline with regard to cognitive-linguistic status. Able to converse appropriately with therapist, review menu and order breakfast by phone, recall details of admission and summarize discussion with neurologist yesterday. No further skilled ST needs identified, SLP to s/o.  Rondel Baton, [place], CCC-SLP Speech-Language Pathologist    Arlana Lindau 08/04/2021, 9:17 AM

## 2021-08-04 NOTE — Progress Notes (Signed)
Occupational Therapy Evaluation Patient Details Name: Jorge Mason MRN: [id number] DOB: 1971/08/05 Today's Date: 08/04/2021   History of Present Illness Jorge Mason is a 50 y.o. male with medical history significant of hypertension, hyperlipidemia, peripheral neuropathy, who presents with left-sided weakness.     Patient states that his symptoms started in Sunday.  He states that he has weakness in both left arm and left leg.  He has numbness and tingling in left arm.  No difficulty swallowing or slurred speech. No vision loss or hearing loss.  He states that his symptoms was little better in Sunday night, but comes back Monday, and has been persistent.  He also reports mild pain in left forearm.  No neck pain or neck injury.  Patient does not have chest pain, cough, shortness breath.  No fever or chills.  No nausea vomiting, diarrhea or abdominal pain.  No symptoms of UTI. 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   Clinical Impression   Jorge Mason was seen for OT evaluation this date. Prior to hospital admission, pt was independent. 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 Pt lives in a mobile home with a ramped entry. Currently pt demonstrates impairments as described below (See OT problem list) which functionally limit his ability to perform ADL/self-care tasks. Pt requires MOD I to don sock on LLE, pt needs extended time/effort/adapted dressing strategies displaying minimal use of L hand 2/2 LUE weakness. SBA for grooming-toothbrushing, standing sinkside ~ 2 min, pt minimally able to incorporate L hand into task. SBA w/ no UE support for simulated toilet t/f, minor LOB on L side, able to self- correct. SBA for functional reaching task, demonstrating good balance for reaching at above head height and ankle height cabinets, L hand unable to maintain grasp on canister, completed task w/ R hand.  Pt would benefit from  skilled OT services to address noted impairments and functional limitations (see below for any additional details) in order to maximize safety and independence while minimizing falls risk and caregiver burden. Upon hospital discharge, recommend OUTPATIENT occupational therapy to maximize pt safety and return to functional independence during meaningful occupations of daily life.      Recommendations for follow up therapy are one component of a multi-disciplinary discharge planning process, led by the attending physician.  Recommendations may be updated based on patient status, additional functional criteria and insurance authorization.   Follow Up Recommendations  Outpatient OT    Assistance Recommended at Discharge Intermittent Supervision/Assistance  Functional Status Assessment  Patient has had a recent decline in their functional status and demonstrates the ability to make significant improvements in function in a reasonable and predictable amount of time.  Equipment Recommendations  BSC/3in1    Recommendations for Other Services       Precautions / Restrictions Precautions Precaution Comments: low fall Restrictions Weight Bearing Restrictions: No      Mobility Bed Mobility Overal bed mobility: Needs Assistance Bed Mobility: Supine to Sit     Supine to sit: Modified independent (Device/Increase time)         Transfers Overall transfer level: Needs assistance Equipment used: None Transfers: Sit to/from Stand Sit to Stand: Supervision                  Balance Overall balance assessment: Needs assistance Sitting-balance support: Feet supported;No upper extremity supported Sitting balance-Leahy Scale: Normal     Standing balance support: No upper  extremity supported;During functional activity Standing balance-Leahy Scale: Fair                             ADL either performed or assessed with clinical judgement   ADL Overall ADL's : Needs  assistance/impaired                                       General ADL Comments: MOD I to don sock on LLE, pt required extended time/effort/adapted dressing strategies displaying minimal use of L hand 2/2 LUE weakness. SBA for grooming-toothbrushing, standing sinkside ~ 2 min, pt minimally able to incorporate L hand into task. SBA w/ no UE support for simulated toilet t/f, minor LOB on L side, able to self- correct. SBA for functional reaching task, demonstrating good balance for reaching at above head height and ankle height cabinets. L hand unable to maintain grasp on canister, completed task w/ R hand.      Pertinent Vitals/Pain Pain Assessment: No/denies pain     Hand Dominance Right   Extremity/Trunk Assessment Upper Extremity Assessment Upper Extremity Assessment: LUE deficits/detail LUE Deficits / Details: Grip 3+, bicep 3, tricep 3, deltoids 2+, AROM ~ 60, PROM ~170,  5 finger opposition thumb to 2-4 digits accessible, unable to achieve 5th finger opposition LUE Sensation: WNL   Lower Extremity Assessment Lower Extremity Assessment: LLE deficits/detail;Defer to PT evaluation LLE Deficits / Details: weakness noted, refer to PT assessment LLE Coordination: decreased gross motor   Cervical / Trunk Assessment Cervical / Trunk Assessment: Normal   Communication Communication Communication: No difficulties   Cognition Arousal/Alertness: Awake/alert Behavior During Therapy: WFL for tasks assessed/performed Overall Cognitive Status: Within Functional Limits for tasks assessed                                       General Comments       Exercises Exercises: Other exercises;General Upper Extremity General Exercises - Upper Extremity Shoulder Flexion: Self ROM;10 reps;Seated Shoulder Extension: Self ROM;10 reps;Seated Elbow Flexion: Self ROM;10 reps;Seated Elbow Extension: Self ROM;10 reps;Seated Digit Composite Flexion: Self ROM;10  reps;Seated;Squeeze ball Composite Extension: Self ROM;10 reps;Seated Other Exercises Other Exercises: Pt educ re: OT role, d/c recs, falls prevention, therapeutic exercise, stroke education, Other Exercises: sup>sit, sit<>stand, LBD, grooming, functional balance task, therapeutic exercises, theraputty Other Exercises: yellow theraputty exercies w/ handout   Shoulder Instructions      Home Living Family/patient expects to be discharged to:: Private residence Living Arrangements: Spouse/significant other Available Help at Discharge: Friend(s);Available PRN/intermittently;Family;Available 24 hours/day Type of Home: Mobile home Home Access: Ramped entrance     Home Layout: One level     Bathroom Shower/Tub: Chief Strategy Officer: Standard     Home Equipment: Hand held shower head;Grab bars - tub/shower;Shower seat - built in   Additional Comments: fiancee has CP - pt assists with transfers, IADLs, and bathing.      Prior Functioning/Environment Prior Level of Function : Independent/Modified Independent;Driving             Mobility Comments: independent, working, is a Technical sales engineer ADLs Comments: independent        OT Problem List: Decreased strength;Decreased range of motion;Decreased activity tolerance;Decreased coordination;Impaired UE functional use      OT Treatment/Interventions: Self-care/ADL training;Therapeutic exercise;Neuromuscular  education;Therapeutic activities    OT Goals(Current goals can be found in the care plan section) Acute Rehab OT Goals Patient Stated Goal: to go home OT Goal Formulation: With patient Time For Goal Achievement: 08/18/21 Potential to Achieve Goals: Good ADL Goals Pt Will Perform Grooming: Independently;standing (w/ no cues to integrate LUE) Pt Will Perform Lower Body Dressing: Independently;sit to/from stand Pt/caregiver will Perform Home Exercise Program: Increased ROM;Increased strength;Left upper extremity;With  theraputty;Independently;With written HEP provided  OT Frequency: Min 5X/week   Barriers to D/C:            Co-evaluation              AM-PAC OT "6 Clicks" Daily Activity     Outcome Measure Help from another person eating meals?: A Little Help from another person taking care of personal grooming?: A Little Help from another person toileting, which includes using toliet, bedpan, or urinal?: A Little Help from another person bathing (including washing, rinsing, drying)?: A Little Help from another person to put on and taking off regular upper body clothing?: A Little Help from another person to put on and taking off regular lower body clothing?: A Little 6 Click Score: 18   End of Session    Activity Tolerance: Patient tolerated treatment well Patient left: in chair;with chair alarm set  OT Visit Diagnosis: Unsteadiness on feet (R26.81);Other abnormalities of gait and mobility (R26.89);Hemiplegia and hemiparesis;Muscle weakness (generalized) (M62.81) Hemiplegia - Right/Left: Left Hemiplegia - dominant/non-dominant: Non-Dominant Hemiplegia - caused by: Cerebral infarction                Time: 9629-5284 OT Time Calculation (min): 35 min Charges:  OT General Charges $OT Visit: 1 Visit OT Evaluation $OT Eval Moderate Complexity: 1 Mod OT Treatments $Self Care/Home Management : 8-22 mins $Therapeutic Exercise: 8-22 mins  [place] [employer], Adine Madura 08/04/2021, 1:58 PM

## 2021-08-04 NOTE — Discharge Summary (Signed)
Jorge Mason ZOX:[id number] DOB: 1971-08-27 DOA: 08/03/2021  PCP: Pcp, No  Admit date: 08/03/2021 Discharge date: 08/04/2021  Admitted From: home Disposition:  home  Recommendations for Outpatient Follow-up:  Follow up with PCP in 1 week Please obtain BMP/CBC in one week Please follow up neurology in 1 week     Discharge Condition:Stable CODE STATUS: Full Diet recommendation: Heart Healthy / Carb Modified  Brief/Interim Summary: Per Jorge Mason is a 51 y.o. male with medical history significant of hypertension, hyperlipidemia, peripheral neuropathy, who presented with left-sided weakness.  Patient found with small acute infarct right ventral medulla.  Patient was admitted to the hospital.  Neurology was consulted.  Stroke: pt has persistent left-sided weakness  CT head is negative for acute intracranial abnormality. MRI brain without contrast Small acute infarct right ventral medulla MRA head no flow visible in the intracranial right vertebral artery after the PICA origin Neurology was consulted.  Initially permissive hypertension allowed Goal LDL <70 ASA 81mg  daily + plavix 75mg  daily x90 days 2/2 severe intracranial stenosis followed by ASA 81mg  daily after that PT consulted, rec. Outpatient PT Spoke to neurology, ok for patient to return to work. Stroke education F/u with neurology as outpatient Lipitor 80mg  qd Echo completed, results below Had MRI cspine-see results below, f/u with pcp       Hypertension Resume home med lisinopril F/u with pcp   Diabetes mellitus without complication [facility]): Patient is taking metformin at home.  Recent A1c 9.7, poorly controlled. F/u with pcp for outpatient management   Neuropathy: Likely diabetic peripheral neuropathy. -Continue home amitriptyline       Discharge Diagnoses:  Principal Problem:   Stroke [facility]) Active Problems:   Left-sided weakness   Hypertension   Diabetes mellitus without complication (HCC)    Neuropathy    Discharge Instructions  Discharge Instructions     Diet - low sodium heart healthy   Complete by: As directed    Diet Carb Modified   Complete by: As directed    Discharge instructions   Complete by: As directed    Ok to return to work F/u with pcp and neurology   Increase activity slowly   Complete by: As directed       Allergies as of 08/04/2021   No Known Allergies      Medication List     STOP taking these medications    cloNIDine 0.1 MG tablet Commonly known as: CATAPRES       TAKE these medications    amitriptyline 25 MG tablet Commonly known as: ELAVIL Take 25 mg by mouth at bedtime.   aspirin 81 MG EC tablet Take 1 tablet (81 mg total) by mouth daily. Swallow whole. Start taking on: August 05, 2021   atorvastatin 80 MG tablet Commonly known as: LIPITOR Take 1 tablet (80 mg total) by mouth daily. Start taking on: August 05, 2021   clopidogrel 75 MG tablet Commonly known as: PLAVIX Take 1 tablet (75 mg total) by mouth daily. Start taking on: August 05, 2021   lisinopril 20 MG tablet Commonly known as: ZESTRIL Take 20 mg by mouth daily.   metFORMIN 500 MG 24 hr tablet Commonly known as: GLUCOPHAGE-XR Take 1,000 mg by mouth 2 (two) times daily.        Follow-up Information     13/05/2021, MD Follow up in 1 week(s).   Specialty: Neurology Contact information: [phone] [facility] [address] [facility] [place] [address] [facility] [phone]  No Known Allergies  Consultations: Neurology   Procedures/Studies: CT HEAD WO CONTRAST  Result Date: 08/03/2021 CLINICAL DATA:  Neurologic deficit. EXAM: CT HEAD WITHOUT CONTRAST TECHNIQUE: Contiguous axial images were obtained from the base of the skull through the vertex without intravenous contrast. COMPARISON:  None. FINDINGS: Brain: The ventricles and sulci are appropriate size for the patient's age. The gray-white matter  discrimination is preserved. There is no acute intracranial hemorrhage. No mass effect or midline shift no extra-axial fluid collection. Vascular: No hyperdense vessel or unexpected calcification. Skull: Normal. Negative for fracture or focal lesion. Sinuses/Orbits: Diffuse mucoperiosteal thickening of paranasal sinuses. The mastoid air cells are clear. No air-fluid level. Other: None IMPRESSION: 1. Unremarkable noncontrast CT of the brain. 2. Paranasal sinus disease. Electronically Signed   By: Elgie Collard M.D.   On: 08/03/2021 02:23   CT ANGIO NECK W OR WO CONTRAST  Result Date: 08/04/2021 CLINICAL DATA:  Follow-up examination for acute stroke. EXAM: CT ANGIOGRAPHY NECK TECHNIQUE: Multidetector CT imaging of the neck was performed using the standard protocol during bolus administration of intravenous contrast. Multiplanar CT image reconstructions and MIPs were obtained to evaluate the vascular anatomy. Carotid stenosis measurements (when applicable) are obtained utilizing NASCET criteria, using the distal internal carotid diameter as the denominator. CONTRAST:  75mL OMNIPAQUE IOHEXOL 350 MG/ML SOLN COMPARISON:  Prior brain MRI from earlier the same day. FINDINGS: Aortic arch: Visualized aortic arch normal in caliber with normal branch pattern. Minimal atheromatous change about the origin of the great vessels without significant stenosis. Right carotid system: Right CCA patent from its origin to the bifurcation without stenosis. Mild mixed plaque about the right carotid bulb without significant stenosis. Right ICA patent distally without stenosis, dissection or occlusion. Atheromatous change noted within the partially visualized right carotid siphon. Left carotid system: Left CCA patent from its origin to the bifurcation without stenosis. Mild calcified plaque about the left carotid bulb/proximal left ICA without significant stenosis. Left ICA patent distally without stenosis, dissection or occlusion.  Scattered atheromatous change noted within the partially visualized left carotid siphon without visible stenosis. Vertebral arteries: Both vertebral arteries arise from the subclavian arteries. No significant proximal subclavian artery stenosis. Left vertebral artery dominant, with a diffusely diminutive right vertebral artery. Atheromatous change noted at the origin of the dominant left vertebral artery without definite significant stenosis. Vertebral arteries otherwise patent distally without stenosis, evidence for dissection or occlusion. Diminutive right vertebral artery terminates in PICA. 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 No internal flow seen within this lesion. Origin of the left PICA normal. Partially visualized basilar widely patent. Skeleton: No discrete or worrisome osseous lesions. No significant spondylosis for age. Other neck: No other acute soft tissue abnormality within the neck. No mass or adenopathy. Upper chest: Visualized upper chest demonstrates no acute finding. IMPRESSION: 1. Mild atheromatous change about the carotid bifurcations/proximal ICAs without hemodynamically significant stenosis. 2. Wide patency of the vertebral arteries within the neck. Left vertebral artery dominant. Right vertebral artery diminutive and terminates in PICA. 3. 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 This is likely of doubtful significance, as no appreciable internal flow is visualized. Electronically Signed   By: Rise Mu M.D.   On: 08/04/2021 02:43   MR ANGIO HEAD WO CONTRAST  Result Date: 08/03/2021 CLINICAL DATA:  Neuro deficit, acute, stroke suspected EXAM: MRI HEAD WITHOUT CONTRAST MRA  HEAD WITHOUT CONTRAST TECHNIQUE: Multiplanar, multi-echo pulse sequences of the brain and surrounding structures were acquired without intravenous  contrast. Angiographic images of the Circle of Willis were acquired using MRA technique without intravenous contrast. COMPARISON:  No pertinent prior exam. FINDINGS: MRI HEAD FINDINGS Brain: There is a 4 mm focus of reduced diffusion at the right ventral aspect of the superior medulla. No evidence of intracranial hemorrhage. Ventricles and sulci are normal in size and configuration. There is no intracranial mass, mass effect, hydrocephalus, or extra-axial collection. Few patchy foci of T2 hyperintensity in the supratentorial white matter likely reflects nonspecific gliosis/demyelination. Vascular: Diminished right vertebral artery flow void. Skull and upper cervical spine: Normal marrow signal is preserved. Sinuses/Orbits: Patchy mucosal thickening. Right maxillary sinus is opacified. Orbits are unremarkable. Other: Mastoid air cells are clear. MRA HEAD FINDINGS Anterior circulation: Intracranial internal carotid arteries are patent with atherosclerotic irregularity. There is mild stenosis at the level of the clinoid on the left. Anterior cerebral arteries are patent. Anterior communicating artery is present. Middle cerebral arteries are patent with mild atherosclerotic irregularity. Posterior circulation: Intracranial left vertebral artery is patent. Proximal right vertebral artery is patent to the PICA origin. There is no apparent flow beyond this despite vessel being present on the MRI brain. Basilar artery is patent. Posterior cerebral arteries are patent. IMPRESSION: Small acute infarct right ventral medulla. Intracranial right vertebral artery is patent to the PICA origin. Beyond this, the vessel appears to be present on MRI brain but there is no flow visible on MRA. Electronically Signed   By: Guadlupe Spanish M.D.   On: 08/03/2021 11:47   MR BRAIN WO CONTRAST  Result Date: 08/03/2021 CLINICAL DATA:  Neuro deficit, acute, stroke suspected EXAM: MRI HEAD WITHOUT CONTRAST MRA HEAD WITHOUT CONTRAST TECHNIQUE:  Multiplanar, multi-echo pulse sequences of the brain and surrounding structures were acquired without intravenous contrast. Angiographic images of the Circle of Willis were acquired using MRA technique without intravenous contrast. COMPARISON:  No pertinent prior exam. FINDINGS: MRI HEAD FINDINGS Brain: There is a 4 mm focus of reduced diffusion at the right ventral aspect of the superior medulla. No evidence of intracranial hemorrhage. Ventricles and sulci are normal in size and configuration. There is no intracranial mass, mass effect, hydrocephalus, or extra-axial collection. Few patchy foci of T2 hyperintensity in the supratentorial white matter likely reflects nonspecific gliosis/demyelination. Vascular: Diminished right vertebral artery flow void. Skull and upper cervical spine: Normal marrow signal is preserved. Sinuses/Orbits: Patchy mucosal thickening. Right maxillary sinus is opacified. Orbits are unremarkable. Other: Mastoid air cells are clear. MRA HEAD FINDINGS Anterior circulation: Intracranial internal carotid arteries are patent with atherosclerotic irregularity. There is mild stenosis at the level of the clinoid on the left. Anterior cerebral arteries are patent. Anterior communicating artery is present. Middle cerebral arteries are patent with mild atherosclerotic irregularity. Posterior circulation: Intracranial left vertebral artery is patent. Proximal right vertebral artery is patent to the PICA origin. There is no apparent flow beyond this despite vessel being present on the MRI brain. Basilar artery is patent. Posterior cerebral arteries are patent. IMPRESSION: Small acute infarct right ventral medulla. Intracranial right vertebral artery is patent to the PICA origin. Beyond this, the vessel appears to be present on MRI brain but there is no flow visible on MRA. Electronically Signed   By: Guadlupe Spanish M.D.   On: 08/03/2021 11:47   MR Cervical Spine Wo Contrast  Result Date:  08/03/2021 CLINICAL DATA:  Spinal stenosis, C-spine. Left arm numbness and  left sided extremity weakness that started .0 in Accession #:    [id number]  Weight:       220.0 lb Date of Birth:  May 06, 1971       BSA:          2.219 m Patient Age:    49 years         BP:           134/85 mmHg Patient Gender: M                HR:           74 bpm. Exam Location:  [facility] Procedure: 2D Echo, Cardiac Doppler and Color Doppler Indications:     Stroke I63.9  History:         Patient has no prior history of Echocardiogram examinations.                  Risk Factors:Hypertension and Diabetes.  Sonographer:     Cristela Blue Referring Phys:  [id number] Brien Few NIU Diagnosing Phys: Debbe Odea MD  Sonographer Comments: Suboptimal apical window. IMPRESSIONS  1. Left ventricular ejection fraction, by estimation, is 60 to 65%. The left ventricle has normal function. The left ventricle has no regional wall motion abnormalities. There is mild left ventricular hypertrophy. Left ventricular diastolic parameters were normal.  2. Right ventricular systolic function is normal. The right ventricular size is normal.  3. The mitral valve is normal in structure. No evidence of mitral valve regurgitation.  4. The aortic valve was not well visualized. Aortic valve regurgitation is not visualized. FINDINGS  Left Ventricle: Left ventricular ejection fraction, by estimation, is 60 to 65%. The left ventricle has normal function. The left ventricle has no regional wall motion abnormalities. The left ventricular internal cavity size was normal in size. There is  mild left ventricular hypertrophy. Left ventricular diastolic parameters were normal. Right Ventricle: The right ventricular size is normal. No  increase in right ventricular wall thickness. Right ventricular systolic function is normal. Left Atrium: Left atrial size was normal in size. Right Atrium: Right atrial size was normal in size. Pericardium: There is no evidence of pericardial effusion. Mitral Valve: The mitral valve is normal in structure. No evidence of mitral valve regurgitation. MV peak gradient, 1.9 mmHg. The mean mitral valve gradient is 1.0 mmHg. Tricuspid Valve: The tricuspid valve is normal in structure. Tricuspid valve regurgitation is not demonstrated. Aortic Valve: The aortic valve was not well visualized. Aortic valve regurgitation is not visualized. Aortic valve mean gradient measures 1.0 mmHg. Aortic valve peak gradient measures 2.0 mmHg. Aortic valve area, by VTI measures 2.73 cm. Pulmonic Valve: The pulmonic valve was not well visualized. Pulmonic valve regurgitation is not visualized. Aorta: The aortic root is normal in size and structure. IAS/Shunts: No atrial level shunt detected by color flow Doppler.  LEFT VENTRICLE PLAX 2D LVIDd:         3.97 cm   Diastology LVIDs:         2.52 cm   LV e' medial:    5.55 cm/s LV PW:         1.14 cm   LV E/e' medial:  12.8 LV IVS:        1.20 cm   LV e' lateral:   11.10 cm/s LVOT diam:     2.10 cm   LV E/e' lateral: 6.4 LV SV:         41 LV SV Index:   19 LVOT Area:     3.46 cm  LEFT ATRIUM  Index        RIGHT ATRIUM           Index LA diam:        2.90 cm 1.31 cm/m   RA Area:     18.10 cm LA Vol (A2C):   23.1 ml 10.41 ml/m  RA Volume:   52.70 ml  23.75 ml/m LA Vol (A4C):   18.8 ml 8.47 ml/m LA Biplane Vol: 21.4 ml 9.65 ml/m  AORTIC VALVE                    PULMONIC VALVE AV Area (Vmax):    2.99 cm     PV Vmax:        0.49 m/s AV Area (Vmean):   2.42 cm     PV Vmean:       32.950 cm/s AV Area (VTI):     2.73 cm     PV VTI:         0.081 m AV Vmax:           70.30 cm/s   PV Peak grad:   0.9 mmHg AV Vmean:          54.600 cm/s  PV Mean grad:   0.5 mmHg AV VTI:             0.151 m      RVOT Peak grad: 2 mmHg AV Peak Grad:      2.0 mmHg AV Mean Grad:      1.0 mmHg LVOT Vmax:         60.70 cm/s LVOT Vmean:        38.100 cm/s LVOT VTI:          0.119 m LVOT/AV VTI ratio: 0.79  AORTA Ao Root diam: 3.50 cm MITRAL VALVE               TRICUSPID VALVE MV Area (PHT): 3.19 cm    TR Peak grad:   8.6 mmHg MV Area VTI:   2.38 cm    TR Vmax:        147.00 cm/s MV Peak grad:  1.9 mmHg MV Mean grad:  1.0 mmHg    SHUNTS MV Vmax:       0.69 m/s    Systemic VTI:  0.12 m MV Vmean:      34.8 cm/s   Systemic Diam: 2.10 cm MV Decel Time: 238 msec    Pulmonic VTI:  0.107 m MV E velocity: 71.10 cm/s MV A velocity: 63.00 cm/s MV E/A ratio:  1.13 Debbe Odea MD Electronically signed by Debbe Odea MD Signature Date/Time: 08/04/2021/1:49:56 PM    Final       Subjective: No shortness of breath, dizziness, or any other complaints  Discharge Exam: Vitals:   08/04/21 0540 08/04/21 0803  BP: (!) 134/91 134/85  Pulse: 67 74  Resp: 18 19  Temp: (!) 97.5 F (36.4 C) (!) 97.4 F (36.3 C)  SpO2: 98% 97%   Vitals:   08/03/21 2031 08/04/21 0141 08/04/21 0540 08/04/21 0803  BP: (!) 145/94 (!) 146/95 (!) 134/91 134/85  Pulse: 80 66 67 74  Resp: Temp: 98.2 F (36.8 C) 97.7 F (36.5 C) (!) 97.5 F (36.4 C) (!) 97.4 F (36.3 C)  TempSrc: Oral Oral Oral Oral  SpO2: 100% 100% 98% 97%  Weight:      Height:        General: Pt is alert, awake, not in  acute distress Cardiovascular: RRR, S1/S2 +, no rubs, no gallops Respiratory: CTA bilaterally, no wheezing, no rhonchi Abdominal: Soft, NT, ND, bowel sounds + Extremities: no edema, no cyanosis Neuro : LU and LLE 3/5 . Rest unremarkable    The results of significant diagnostics from this hospitalization (including imaging, microbiology, ancillary and laboratory) are listed below for reference.     Microbiology: Recent Results (from the past 240 hour(s))  Resp Panel by RT-PCR (Flu A&B, Covid) Nasopharyngeal Swab      Status: None   Collection Time: 08/03/21  9:15 AM   Specimen: Nasopharyngeal Swab; Nasopharyngeal(NP) swabs in vial transport medium  Result Value Ref Range Status   SARS Coronavirus 2 by RT PCR NEGATIVE NEGATIVE Final    Comment: (NOTE) SARS-CoV-2 target nucleic acids are NOT DETECTED.  The SARS-CoV-2 RNA is generally detectable in upper respiratory specimens during the acute phase of infection. The lowest concentration of SARS-CoV-2 viral copies this assay can detect is 138 copies/mL. A negative result does not preclude SARS-Cov-2 infection and should not be used as the sole basis for treatment or other patient management decisions. A negative result may occur with  improper specimen collection/handling, submission of specimen other than nasopharyngeal swab, presence of viral mutation(s) within the areas targeted by this assay, and inadequate number of viral copies(<138 copies/mL). A negative result must be combined with clinical observations, patient history, and epidemiological information. The expected result is Negative.  Fact Sheet for Patients:  BloggerCourse.com  Fact Sheet for Healthcare Providers:  SeriousBroker.it  This test is no t yet approved or cleared by the [place] FDA and  has been authorized for detection and/or diagnosis of SARS-CoV-2 by FDA under an Emergency Use Authorization (EUA). This EUA will remain  in effect (meaning this test can be used) for the duration of the COVID-19 declaration under Section 564(b)(1) of the Act, 21 U.S.C.section 360bbb-3(b)(1), unless the authorization is terminated  or revoked sooner.       Influenza A by PCR NEGATIVE NEGATIVE Final   Influenza B by PCR NEGATIVE NEGATIVE Final    Comment: (NOTE) The Xpert Xpress SARS-CoV-2/FLU/RSV plus assay is intended as an aid in the diagnosis of influenza from Nasopharyngeal swab specimens and should not be used as a sole basis  for treatment. Nasal washings and aspirates are unacceptable for Xpert Xpress SARS-CoV-2/FLU/RSV testing.  Fact Sheet for Patients: BloggerCourse.com  Fact Sheet for Healthcare Providers: SeriousBroker.it  This test is not yet approved or cleared by the [place] FDA and has been authorized for detection and/or diagnosis of SARS-CoV-2 by FDA under an Emergency Use Authorization (EUA). This EUA will remain in effect (meaning this test can be used) for the duration of the COVID-19 declaration under Section 564(b)(1) of the Act, 21 U.S.C. section 360bbb-3(b)(1), unless the authorization is terminated or revoked.  Performed at [facility], [address] [place], [place] [address]      Labs: BNP (last 3 results) No results for input(s): BNP in the last 8760 hours. Basic Metabolic Panel: Recent Labs  Lab 08/03/21 0131  NA 135  K 3.7  CL 99  CO2 26  GLUCOSE 245*  BUN 16  CREATININE 1.10  CALCIUM 9.6   Liver Function Tests: Recent Labs  Lab 08/03/21 0131  AST 23  ALT 24  ALKPHOS 60  BILITOT 2.1*  PROT 8.2*  ALBUMIN 4.3   No results for input(s): LIPASE, AMYLASE in the last 168 hours. No results for input(s): AMMONIA in the last  168 hours. CBC: Recent Labs  Lab 08/03/21 0131  WBC 7.6  NEUTROABS 4.1  HGB 14.8  HCT 42.9  MCV 88.8  PLT 265   Cardiac Enzymes: No results for input(s): CKTOTAL, CKMB, CKMBINDEX, TROPONINI in the last 168 hours. BNP: Invalid input(s): POCBNP CBG: Recent Labs  Lab 08/03/21 1804 08/03/21 2032 08/04/21 0801 08/04/21 1118 08/04/21 1337  GLUCAP 254* 187* 222* 324* 198*   D-Dimer No results for input(s): DDIMER in the last 72 hours. Hgb A1c Recent Labs    08/04/21 0448  HGBA1C 9.5*   Lipid Profile Recent Labs    08/04/21 0448  CHOL 204*  HDL 41  LDLCALC UNABLE TO CALCULATE IF TRIGLYCERIDE OVER 400 mg/dL  TRIG 676*  CHOLHDL 5.0  LDLDIRECT 96.0    Thyroid function studies No results for input(s): TSH, T4TOTAL, T3FREE, THYROIDAB in the last 72 hours.  Invalid input(s): FREET3 Anemia work up No results for input(s): VITAMINB12, FOLATE, FERRITIN, TIBC, IRON, RETICCTPCT in the last 72 hours. Urinalysis No results found for: COLORURINE, APPEARANCEUR, LABSPEC, PHURINE, GLUCOSEU, HGBUR, BILIRUBINUR, KETONESUR, PROTEINUR, UROBILINOGEN, NITRITE, LEUKOCYTESUR Sepsis Labs Invalid input(s): PROCALCITONIN,  WBC,  LACTICIDVEN Microbiology Recent Results (from the past 240 hour(s))  Resp Panel by RT-PCR (Flu A&B, Covid) Nasopharyngeal Swab     Status: None   Collection Time: 08/03/21  9:15 AM   Specimen: Nasopharyngeal Swab; Nasopharyngeal(NP) swabs in vial transport medium  Result Value Ref Range Status   SARS Coronavirus 2 by RT PCR NEGATIVE NEGATIVE Final    Comment: (NOTE) SARS-CoV-2 target nucleic acids are NOT DETECTED.  The SARS-CoV-2 RNA is generally detectable in upper respiratory specimens during the acute phase of infection. The lowest concentration of SARS-CoV-2 viral copies this assay can detect is 138 copies/mL. A negative result does not preclude SARS-Cov-2 infection and should not be used as the sole basis for treatment or other patient management decisions. A negative result may occur with  improper specimen collection/handling, submission of specimen other than nasopharyngeal swab, presence of viral mutation(s) within the areas targeted by this assay, and inadequate number of viral copies(<138 copies/mL). A negative result must be combined with clinical observations, patient history, and epidemiological information. The expected result is Negative.  Fact Sheet for Patients:  BloggerCourse.com  Fact Sheet for Healthcare Providers:  SeriousBroker.it  This test is no t yet approved or cleared by the [place] FDA and  has been authorized for detection and/or  diagnosis of SARS-CoV-2 by FDA under an Emergency Use Authorization (EUA). This EUA will remain  in effect (meaning this test can be used) for the duration of the COVID-19 declaration under Section 564(b)(1) of the Act, 21 U.S.C.section 360bbb-3(b)(1), unless the authorization is terminated  or revoked sooner.       Influenza A by PCR NEGATIVE NEGATIVE Final   Influenza B by PCR NEGATIVE NEGATIVE Final    Comment: (NOTE) The Xpert Xpress SARS-CoV-2/FLU/RSV plus assay is intended as an aid in the diagnosis of influenza from Nasopharyngeal swab specimens and should not be used as a sole basis for treatment. Nasal washings and aspirates are unacceptable for Xpert Xpress SARS-CoV-2/FLU/RSV testing.  Fact Sheet for Patients: BloggerCourse.com  Fact Sheet for Healthcare Providers: SeriousBroker.it  This test is not yet approved or cleared by the [place] FDA and has been authorized for detection and/or diagnosis of SARS-CoV-2 by FDA under an Emergency Use Authorization (EUA). This EUA will remain in effect (meaning this test can be used) for the duration of the COVID-19 declaration  under Section 564(b)(1) of the Act, 21 U.S.C. section 360bbb-3(b)(1), unless the authorization is terminated or revoked.  Performed at [facility], [address], [place], [place] [address]      Time coordinating discharge: Over 30 minutes  SIGNED:   Lynn Ito, MD  [facility] 08/04/2021, 3:10 PM Pager   If 7PM-7AM, please contact night-coverage [email or web address] Password [email or web address]

## 2023-08-19 ENCOUNTER — Encounter
Admission: EM | Disposition: A | Discharge status: Home / Self Care | Payer: Self-pay | Source: Home / Self Care | Attending: Student

## 2023-08-19 ENCOUNTER — Inpatient Hospital Stay
Admission: EM | Admit: 2023-08-19 | Discharge: 2023-08-20 | DRG: 322 | Disposition: A | Discharge status: Home / Self Care | Payer: Self-pay | Attending: Student | Admitting: Student

## 2023-08-19 ENCOUNTER — Inpatient Hospital Stay: Payer: Self-pay | Admitting: Anesthesiology

## 2023-08-19 ENCOUNTER — Other Ambulatory Visit: Payer: Self-pay

## 2023-08-19 ENCOUNTER — Emergency Department: Payer: Self-pay

## 2023-08-19 DIAGNOSIS — Z8249 Family history of ischemic heart disease and other diseases of the circulatory system: Secondary | ICD-10-CM

## 2023-08-19 DIAGNOSIS — E785 Hyperlipidemia, unspecified: Secondary | ICD-10-CM | POA: Diagnosis present

## 2023-08-19 DIAGNOSIS — E1140 Type 2 diabetes mellitus with diabetic neuropathy, unspecified: Secondary | ICD-10-CM | POA: Diagnosis present

## 2023-08-19 DIAGNOSIS — I233 Rupture of cardiac wall without hemopericardium as current complication following acute myocardial infarction: Secondary | ICD-10-CM

## 2023-08-19 DIAGNOSIS — I69354 Hemiplegia and hemiparesis following cerebral infarction affecting left non-dominant side: Secondary | ICD-10-CM

## 2023-08-19 DIAGNOSIS — Z823 Family history of stroke: Secondary | ICD-10-CM

## 2023-08-19 DIAGNOSIS — Z955 Presence of coronary angioplasty implant and graft: Secondary | ICD-10-CM

## 2023-08-19 DIAGNOSIS — R300 Dysuria: Secondary | ICD-10-CM | POA: Diagnosis present

## 2023-08-19 DIAGNOSIS — Z7984 Long term (current) use of oral hypoglycemic drugs: Secondary | ICD-10-CM

## 2023-08-19 DIAGNOSIS — Z9889 Other specified postprocedural states: Secondary | ICD-10-CM

## 2023-08-19 DIAGNOSIS — I10 Essential (primary) hypertension: Secondary | ICD-10-CM | POA: Diagnosis present

## 2023-08-19 DIAGNOSIS — Z79899 Other long term (current) drug therapy: Secondary | ICD-10-CM

## 2023-08-19 DIAGNOSIS — I2102 ST elevation (STEMI) myocardial infarction involving left anterior descending coronary artery: Principal | ICD-10-CM | POA: Diagnosis present

## 2023-08-19 DIAGNOSIS — E1165 Type 2 diabetes mellitus with hyperglycemia: Secondary | ICD-10-CM

## 2023-08-19 DIAGNOSIS — I213 ST elevation (STEMI) myocardial infarction of unspecified site: Principal | ICD-10-CM

## 2023-08-19 DIAGNOSIS — R079 Chest pain, unspecified: Secondary | ICD-10-CM

## 2023-08-19 DIAGNOSIS — Z91148 Patient's other noncompliance with medication regimen for other reason: Secondary | ICD-10-CM

## 2023-08-19 DIAGNOSIS — Z8673 Personal history of transient ischemic attack (TIA), and cerebral infarction without residual deficits: Secondary | ICD-10-CM

## 2023-08-19 HISTORY — PX: LEFT HEART CATH AND CORONARY ANGIOGRAPHY: CATH118249

## 2023-08-19 HISTORY — PX: CORONARY/GRAFT ACUTE MI REVASCULARIZATION: CATH118305

## 2023-08-19 LAB — CBC WITH DIFFERENTIAL/PLATELET
Abs Immature Granulocytes: 0.02 10*3/uL (ref 0.00–0.07)
Basophils Absolute: 0.1 10*3/uL (ref 0.0–0.1)
Basophils Relative: 1 %
Eosinophils Absolute: 0.3 10*3/uL (ref 0.0–0.5)
Eosinophils Relative: 3 %
HCT: 46.9 % (ref 39.0–52.0)
Hemoglobin: 15.5 g/dL (ref 13.0–17.0)
Immature Granulocytes: 0 %
Lymphocytes Relative: 42 %
Lymphs Abs: 3.8 10*3/uL (ref 0.7–4.0)
MCH: 28.8 pg (ref 26.0–34.0)
MCHC: 33 g/dL (ref 30.0–36.0)
MCV: 87.2 fL (ref 80.0–100.0)
Monocytes Absolute: 0.7 10*3/uL (ref 0.1–1.0)
Monocytes Relative: 8 %
Neutro Abs: 4.2 10*3/uL (ref 1.7–7.7)
Neutrophils Relative %: 46 %
Platelets: 277 10*3/uL (ref 150–400)
RBC: 5.38 MIL/uL (ref 4.22–5.81)
RDW: 12.8 % (ref 11.5–15.5)
WBC: 9 10*3/uL (ref 4.0–10.5)
nRBC: 0 % (ref 0.0–0.2)

## 2023-08-19 LAB — COMPREHENSIVE METABOLIC PANEL
ALT: 23 U/L (ref 0–44)
AST: 26 U/L (ref 15–41)
Albumin: 4.2 g/dL (ref 3.5–5.0)
Alkaline Phosphatase: 73 U/L (ref 38–126)
Anion gap: 12 (ref 5–15)
BUN: 21 mg/dL — ABNORMAL HIGH (ref 6–20)
CO2: 25 mmol/L (ref 22–32)
Calcium: 9.4 mg/dL (ref 8.9–10.3)
Chloride: 97 mmol/L — ABNORMAL LOW (ref 98–111)
Creatinine, Ser: 1.08 mg/dL (ref 0.61–1.24)
GFR, Estimated: >60 mL/min (ref >60)
Glucose, Bld: 244 mg/dL — ABNORMAL HIGH (ref 70–99)
Potassium: 3.2 mmol/L — ABNORMAL LOW (ref 3.5–5.1)
Sodium: 134 mmol/L — ABNORMAL LOW (ref 135–145)
Total Bilirubin: 2 mg/dL — ABNORMAL HIGH (ref <1.2)
Total Protein: 7.6 g/dL (ref 6.5–8.1)

## 2023-08-19 LAB — BASIC METABOLIC PANEL
Anion gap: 9 (ref 5–15)
BUN: 19 mg/dL (ref 6–20)
CO2: 23 mmol/L (ref 22–32)
Calcium: 9 mg/dL (ref 8.9–10.3)
Chloride: 100 mmol/L (ref 98–111)
Creatinine, Ser: 1.04 mg/dL (ref 0.61–1.24)
GFR, Estimated: >60 mL/min (ref >60)
Glucose, Bld: 252 mg/dL — ABNORMAL HIGH (ref 70–99)
Potassium: 4.3 mmol/L (ref 3.5–5.1)
Sodium: 132 mmol/L — ABNORMAL LOW (ref 135–145)

## 2023-08-19 LAB — CBC
HCT: 40.3 % (ref 39.0–52.0)
Hemoglobin: 14.1 g/dL (ref 13.0–17.0)
MCH: 29.2 pg (ref 26.0–34.0)
MCHC: 35 g/dL (ref 30.0–36.0)
MCV: 83.4 fL (ref 80.0–100.0)
Platelets: 235 10*3/uL (ref 150–400)
RBC: 4.83 MIL/uL (ref 4.22–5.81)
RDW: 12.8 % (ref 11.5–15.5)
WBC: 9 10*3/uL (ref 4.0–10.5)
nRBC: 0 % (ref 0.0–0.2)

## 2023-08-19 LAB — GLUCOSE, CAPILLARY
Glucose-Capillary: 273 mg/dL — ABNORMAL HIGH (ref 70–99)
Glucose-Capillary: 247 mg/dL — ABNORMAL HIGH (ref 70–99)
Glucose-Capillary: 251 mg/dL — ABNORMAL HIGH (ref 70–99)
Glucose-Capillary: 187 mg/dL — ABNORMAL HIGH (ref 70–99)
Glucose-Capillary: 211 mg/dL — ABNORMAL HIGH (ref 70–99)

## 2023-08-19 LAB — TROPONIN I (HIGH SENSITIVITY)
Troponin I (High Sensitivity): 220 ng/L (ref <18)
Troponin I (High Sensitivity): 16625 ng/L (ref <18)

## 2023-08-19 LAB — MAGNESIUM: Magnesium: 1.7 mg/dL (ref 1.7–2.4)

## 2023-08-19 LAB — HEMOGLOBIN A1C
Hgb A1c MFr Bld: 11.6 % — ABNORMAL HIGH (ref 4.8–5.6)
Mean Plasma Glucose: 286.22 mg/dL

## 2023-08-19 LAB — PHOSPHORUS: Phosphorus: 3.6 mg/dL (ref 2.5–4.6)

## 2023-08-19 LAB — MRSA NEXT GEN BY PCR, NASAL: MRSA by PCR Next Gen: NOT DETECTED

## 2023-08-19 LAB — HIV ANTIBODY (ROUTINE TESTING W REFLEX): HIV Screen 4th Generation wRfx: NONREACTIVE

## 2023-08-19 LAB — LIPASE, BLOOD: Lipase: 33 U/L (ref 11–51)

## 2023-08-19 LAB — BRAIN NATRIURETIC PEPTIDE: B Natriuretic Peptide: 28.7 pg/mL (ref 0.0–100.0)

## 2023-08-19 SURGERY — CORONARY/GRAFT ACUTE MI REVASCULARIZATION
Anesthesia: Moderate Sedation

## 2023-08-19 MED ORDER — POTASSIUM CHLORIDE CRYS ER 20 MEQ PO TBCR
40.0000 meq | EXTENDED_RELEASE_TABLET | ORAL | Status: AC
  Administered 2023-08-19 (×2): 40 meq via ORAL
  Filled 2023-08-19 (×2): qty 2

## 2023-08-19 MED ORDER — ONDANSETRON HCL 4 MG/2ML IJ SOLN: 4.0000 mg | Freq: Four times a day (QID) | INTRAMUSCULAR | Status: DC | PRN

## 2023-08-19 MED ORDER — SODIUM CHLORIDE 0.9 % IV SOLN
250.0000 mL | INTRAVENOUS | Status: AC | PRN
Start: 2023-08-19 — End: 2023-08-20

## 2023-08-19 MED ORDER — HEPARIN (PORCINE) IN NACL 1000-0.9 UT/500ML-% IV SOLN
INTRAVENOUS | Status: DC | PRN
  Administered 2023-08-19 (×2): 500 mL

## 2023-08-19 MED ORDER — MORPHINE SULFATE (PF) 4 MG/ML IV SOLN
4.0000 mg | Freq: Once | INTRAVENOUS | Status: AC
  Administered 2023-08-19: 4 mg via INTRAVENOUS
  Filled 2023-08-19: qty 1

## 2023-08-19 MED ORDER — ASPIRIN 81 MG PO CHEW
324.0000 mg | CHEWABLE_TABLET | Freq: Once | ORAL | Status: AC
  Administered 2023-08-19: 324 mg via ORAL
  Filled 2023-08-19: qty 4

## 2023-08-19 MED ORDER — ASPIRIN 81 MG PO CHEW
81.0000 mg | CHEWABLE_TABLET | Freq: Every day | ORAL | Status: DC
  Administered 2023-08-20: 81 mg via ORAL
  Filled 2023-08-19: qty 1

## 2023-08-19 MED ORDER — ONDANSETRON HCL 4 MG/2ML IJ SOLN
4.0000 mg | Freq: Once | INTRAMUSCULAR | Status: AC
  Administered 2023-08-19: 4 mg via INTRAVENOUS
  Filled 2023-08-19: qty 2

## 2023-08-19 MED ORDER — TAMSULOSIN HCL 0.4 MG PO CAPS
0.4000 mg | ORAL_CAPSULE | Freq: Every day | ORAL | Status: DC
  Administered 2023-08-19: 0.4 mg via ORAL
  Filled 2023-08-19: qty 1

## 2023-08-19 MED ORDER — HEPARIN SODIUM (PORCINE) 1000 UNIT/ML IJ SOLN
INTRAMUSCULAR | Status: AC
  Filled 2023-08-19: qty 10

## 2023-08-19 MED ORDER — ACETAMINOPHEN 325 MG PO TABS: 650.0000 mg | ORAL_TABLET | ORAL | Status: DC | PRN

## 2023-08-19 MED ORDER — HYDRALAZINE HCL 20 MG/ML IJ SOLN: 10.0000 mg | INTRAMUSCULAR | Status: AC | PRN

## 2023-08-19 MED ORDER — CHLORHEXIDINE GLUCONATE CLOTH 2 % EX PADS
6.0000 | MEDICATED_PAD | Freq: Every day | CUTANEOUS | Status: DC
  Administered 2023-08-19: 6 via TOPICAL

## 2023-08-19 MED ORDER — HEPARIN (PORCINE) 25000 UT/250ML-% IV SOLN
1350.0000 [IU]/h | INTRAVENOUS | Status: DC
  Administered 2023-08-19: 1350 [IU]/h via INTRAVENOUS
  Filled 2023-08-19: qty 250

## 2023-08-19 MED ORDER — ENOXAPARIN SODIUM 40 MG/0.4ML IJ SOSY
40.0000 mg | PREFILLED_SYRINGE | INTRAMUSCULAR | Status: DC
  Administered 2023-08-19 – 2023-08-20 (×2): 40 mg via SUBCUTANEOUS
  Filled 2023-08-19 (×2): qty 0.4

## 2023-08-19 MED ORDER — INSULIN ASPART 100 UNIT/ML IJ SOLN
0.0000 [IU] | Freq: Every day | INTRAMUSCULAR | Status: DC
  Administered 2023-08-19: 2 [IU] via SUBCUTANEOUS
  Filled 2023-08-19: qty 1

## 2023-08-19 MED ORDER — SODIUM CHLORIDE 0.9% FLUSH: 3.0000 mL | INTRAVENOUS | Status: DC | PRN

## 2023-08-19 MED ORDER — FENTANYL CITRATE (PF) 100 MCG/2ML IJ SOLN
INTRAMUSCULAR | Status: DC | PRN
  Administered 2023-08-19: 25 ug via INTRAVENOUS

## 2023-08-19 MED ORDER — TICAGRELOR 90 MG PO TABS
90.0000 mg | ORAL_TABLET | Freq: Two times a day (BID) | ORAL | Status: DC
  Administered 2023-08-19 – 2023-08-20 (×3): 90 mg via ORAL
  Filled 2023-08-19 (×3): qty 1

## 2023-08-19 MED ORDER — TICAGRELOR 90 MG PO TABS
ORAL_TABLET | ORAL | Status: DC | PRN
  Administered 2023-08-19: 180 mg via ORAL

## 2023-08-19 MED ORDER — SODIUM CHLORIDE 0.9% FLUSH
3.0000 mL | Freq: Two times a day (BID) | INTRAVENOUS | Status: DC
  Administered 2023-08-19 – 2023-08-20 (×2): 3 mL via INTRAVENOUS

## 2023-08-19 MED ORDER — LABETALOL HCL 5 MG/ML IV SOLN: 10.0000 mg | INTRAVENOUS | Status: AC | PRN

## 2023-08-19 MED ORDER — VERAPAMIL HCL 2.5 MG/ML IV SOLN
INTRAVENOUS | Status: AC
  Filled 2023-08-19: qty 2

## 2023-08-19 MED ORDER — HEPARIN SODIUM (PORCINE) 5000 UNIT/ML IJ SOLN
4000.0000 [IU] | Freq: Once | INTRAMUSCULAR | Status: DC
  Administered 2023-08-19: 4000 [IU] via INTRAVENOUS
  Filled 2023-08-19: qty 1

## 2023-08-19 MED ORDER — SODIUM CHLORIDE 0.9 % WEIGHT BASED INFUSION
1.0000 mL/kg/h | INTRAVENOUS | Status: AC
  Administered 2023-08-19 (×2): 1 mL/kg/h via INTRAVENOUS

## 2023-08-19 MED ORDER — ATORVASTATIN CALCIUM 20 MG PO TABS
80.0000 mg | ORAL_TABLET | Freq: Every day | ORAL | Status: DC
  Administered 2023-08-19 – 2023-08-20 (×2): 80 mg via ORAL
  Filled 2023-08-19 (×2): qty 4
  Filled 2023-08-19: qty 1

## 2023-08-19 MED ORDER — NITROGLYCERIN 0.4 MG SL SUBL: 0.4000 mg | SUBLINGUAL_TABLET | SUBLINGUAL | Status: DC | PRN

## 2023-08-19 MED ORDER — INSULIN ASPART 100 UNIT/ML IJ SOLN
0.0000 [IU] | Freq: Three times a day (TID) | INTRAMUSCULAR | Status: DC
  Administered 2023-08-19: 8 [IU] via SUBCUTANEOUS
  Administered 2023-08-19: 5 [IU] via SUBCUTANEOUS
  Administered 2023-08-19: 3 [IU] via SUBCUTANEOUS
  Administered 2023-08-20: 5 [IU] via SUBCUTANEOUS
  Administered 2023-08-20: 3 [IU] via SUBCUTANEOUS
  Filled 2023-08-19 (×5): qty 1

## 2023-08-19 MED ORDER — HEPARIN BOLUS VIA INFUSION
4000.0000 [IU] | Freq: Once | INTRAVENOUS | Status: AC
  Administered 2023-08-19: 4000 [IU] via INTRAVENOUS
  Filled 2023-08-19: qty 4000

## 2023-08-19 MED ORDER — MIDAZOLAM HCL 2 MG/2ML IJ SOLN
INTRAMUSCULAR | Status: DC | PRN
  Administered 2023-08-19: 1 mg via INTRAVENOUS

## 2023-08-19 MED ORDER — HEPARIN SODIUM (PORCINE) 1000 UNIT/ML IJ SOLN
INTRAMUSCULAR | Status: DC | PRN
  Administered 2023-08-19: 4000 [IU] via INTRAVENOUS
  Administered 2023-08-19: 8000 [IU] via INTRAVENOUS
  Administered 2023-08-19: 3000 [IU] via INTRAVENOUS

## 2023-08-19 MED ORDER — HEPARIN (PORCINE) IN NACL 1000-0.9 UT/500ML-% IV SOLN
INTRAVENOUS | Status: AC
  Filled 2023-08-19: qty 1000

## 2023-08-19 MED ORDER — LIDOCAINE HCL 1 % IJ SOLN
INTRAMUSCULAR | Status: AC
  Filled 2023-08-19: qty 20

## 2023-08-19 MED ORDER — MIDAZOLAM HCL 2 MG/2ML IJ SOLN
INTRAMUSCULAR | Status: AC
Start: 2023-08-19 — End: ?
  Filled 2023-08-19: qty 2

## 2023-08-19 MED ORDER — METOPROLOL SUCCINATE ER 50 MG PO TB24
25.0000 mg | ORAL_TABLET | Freq: Every day | ORAL | Status: DC
  Administered 2023-08-20: 25 mg via ORAL
  Filled 2023-08-19 (×2): qty 1

## 2023-08-19 MED ORDER — TICAGRELOR 90 MG PO TABS
ORAL_TABLET | ORAL | Status: AC
  Filled 2023-08-19: qty 2

## 2023-08-19 MED ORDER — IOHEXOL 300 MG/ML  SOLN
INTRAMUSCULAR | Status: DC | PRN
  Administered 2023-08-19: 244 mL

## 2023-08-19 MED ORDER — HEPARIN (PORCINE) IN NACL 1000-0.9 UT/500ML-% IV SOLN
INTRAVENOUS | Status: AC
  Filled 2023-08-19: qty 500

## 2023-08-19 MED ORDER — FENTANYL CITRATE (PF) 100 MCG/2ML IJ SOLN
INTRAMUSCULAR | Status: AC
  Filled 2023-08-19: qty 2

## 2023-08-19 MED ORDER — LISINOPRIL 20 MG PO TABS: 20.0000 mg | ORAL_TABLET | Freq: Every day | ORAL | Status: DC

## 2023-08-19 MED ORDER — LIDOCAINE HCL (PF) 1 % IJ SOLN
INTRAMUSCULAR | Status: DC | PRN
  Administered 2023-08-19: 2 mL

## 2023-08-19 MED ORDER — VERAPAMIL HCL 2.5 MG/ML IV SOLN
INTRAVENOUS | Status: DC | PRN
  Administered 2023-08-19: 2.5 mg via INTRAVENOUS

## 2023-08-19 SURGICAL SUPPLY — 21 items
BALLN TREK RX 2.25X12 (BALLOONS) ×1
BALLN ~~LOC~~ TREK NEO RX 3.0X12 (BALLOONS) ×1
BALLOON TREK RX 2.25X12 (BALLOONS) IMPLANT
BALLOON ~~LOC~~ TREK NEO RX 3.0X12 (BALLOONS) IMPLANT
CATH INFINITI 5FR ANG PIGTAIL (CATHETERS) IMPLANT
CATH INFINITI JR4 5F (CATHETERS) IMPLANT
CATH VISTA GUIDE 6FR XB3.5 (CATHETERS) IMPLANT
DEVICE RAD TR BAND REGULAR (VASCULAR PRODUCTS) IMPLANT
DRAPE BRACHIAL (DRAPES) IMPLANT
GLIDESHEATH SLEND SS 6F .021 (SHEATH) IMPLANT
GUIDEWIRE INQWIRE 1.5J.035X260 (WIRE) IMPLANT
INQWIRE 1.5J .035X260CM (WIRE) ×1
KIT ENCORE 26 ADVANTAGE (KITS) IMPLANT
PACK CARDIAC CATH (CUSTOM PROCEDURE TRAY) ×1 IMPLANT
PROTECTION STATION PRESSURIZED (MISCELLANEOUS) ×1
SET ATX-X65L (MISCELLANEOUS) IMPLANT
STATION PROTECTION PRESSURIZED (MISCELLANEOUS) IMPLANT
STENT ONYX FRONTIER 2.25X30 (Permanent Stent) IMPLANT
STENT ONYX FRONTIER 2.75X15 (Permanent Stent) IMPLANT
TUBING CIL FLEX 10 FLL-RA (TUBING) IMPLANT
WIRE ASAHI PROWATER 180CM (WIRE) IMPLANT

## 2023-08-19 NOTE — Progress Notes (Signed)
   08/19/23 0200  Spiritual Encounters  Type of Visit Initial  Care provided to: Patient  Conversation partners present during encounter Nurse  Referral source Code page  Reason for visit Urgent spiritual support  OnCall Visit Yes  Spiritual Framework  Presenting Themes Meaning/purpose/sources of inspiration  Patient Stress Factors Major life changes  Interventions  Spiritual Care Interventions Made Established relationship of care and support;Compassionate presence;Prayer  Spiritual Care Plan  Spiritual Care Issues Still Outstanding No further spiritual care needs at this time (see row info)   Let patient know that spiritual care is here for them. Patient want pray I prayer with patient during that time. There was no family present in the hospital. Nurses station in the front lobby said he drove into the ER alone.

## 2023-08-19 NOTE — Assessment & Plan Note (Signed)
Blood sugar over 200 on arrival Hold home glipizide and metformin Sliding scale insulin coverage

## 2023-08-19 NOTE — Plan of Care (Signed)
Patient was seen and examined at bedside in the ICU.  Patient came in with NSTEMI, 2 stents were placed on the LAD.  Chest pain was 10/10 and right now he just feels some soreness, chest pain is 3-4/10.  Patient denied any other complaints, no shortness of breath, no palpitations. Blood pressure remains soft, we will continue to monitor Hypokalemia potassium repleted. We will continue dual antibiotic therapy and monitor today, plan is for discharge tomorrow a.m. as per cardiology.

## 2023-08-19 NOTE — Consult Note (Signed)
[facility] Card[facility]  CARDIOLOGY CONSULT NOTE  Patient ID: Jorge Mason MRN: [id number] DOB/AGE: Sep 27, 1970 52 y.o.  Admit date: 08/19/2023 Referring Physician Dolores Frame Primary Physician [facility] Primary Cardiologist  Reason for Consultation anterior STEMI  HPI: 52 year old gentleman referred for evaluation of anterior STEMI.  The patient presents to [facility] ED with greater than 24-hour history of intermittent substernal chest pain.  Initial ECG revealed sinus rhythm with elevations leads I, aVL, V2 through V4 consistent with anterolateral STEMI.  Patient was brought to the cardiac catheterization laboratory emergently revealed ulcerative 95% stenosis ostial/proximal LAD and 75% stenosis mid to distal LAD.  The patient underwent primary PCI receiving 2.75 x 15 mm Onyx Frontier drug-eluting stent ostial/proximal LAD, and 2.25 x 30 mm Onyx Frontier drug-eluting stent mid/distal LAD.  Left ventriculography revealed mildly reduced left ventricular function with estimated LV ejection fraction 45% with apical hypokinesis.  Review of systems complete and found to be negative unless listed above     Past Medical History:  Diagnosis Date   Diabetes mellitus without complication (HCC)    Hypertension    Neuropathy     Past Surgical History:  Procedure Laterality Date   dental procedure      Medications Prior to Admission  Medication Sig Dispense Refill Last Dose   amitriptyline (ELAVIL) 25 MG tablet Take 25 mg by mouth at bedtime.      atorvastatin (LIPITOR) 80 MG tablet Take 1 tablet (80 mg total) by mouth daily. 30 tablet 1    lisinopril (ZESTRIL) 20 MG tablet Take 20 mg by mouth daily.      metFORMIN (GLUCOPHAGE-XR) 500 MG 24 hr tablet Take 1,000 mg by mouth 2 (two) times daily.      Social History   Socioeconomic History   Marital status: Married    Spouse name: Not on file   Number of children: Not on file   Years of education: Not on file   Highest education level: Not on file   Occupational History   Not on file  Tobacco Use   Smoking status: Never   Smokeless tobacco: Never  Substance and Sexual Activity   Alcohol use: Never   Drug use: Never   Sexual activity: Not on file  Other Topics Concern   Not on file  Social History Narrative   Not on file   Social Determinants of Health   Financial Resource Strain: Not on file  Food Insecurity: Not on file  Transportation Needs: Not on file  Physical Activity: Not on file  Stress: Not on file  Social Connections: Not on file  Intimate Partner Violence: Not on file    Family History  Problem Relation Age of Onset   Stroke Mother    Stroke Father    Heart attack Father       Review of systems complete and found to be negative unless listed above      PHYSICAL EXAM  General: Well developed, well nourished, in no acute distress HEENT:  Normocephalic and atramatic Neck:  No JVD.  Lungs: Clear bilaterally to auscultation and percussion. Heart: HRRR . Normal S1 and S2 without gallops or murmurs.  Abdomen: Bowel sounds are positive, abdomen soft and non-tender  Msk:  Back normal, normal gait. Normal strength and tone for age. Extremities: No clubbing, cyanosis or edema.   Neuro: Alert and oriented X 3. Psych:  Good affect, responds appropriately  Labs:   Lab Results  Component Value Date   WBC 9.0 08/19/2023   HGB  15.5 08/19/2023   HCT 46.9 08/19/2023   MCV 87.2 08/19/2023   PLT 277 08/19/2023    Recent Labs  Lab 08/19/23 0136  NA 134*  K 3.2*  CL 97*  CO2 25  BUN 21*  CREATININE 1.08  CALCIUM 9.4  PROT 7.6  BILITOT 2.0*  ALKPHOS 73  ALT 23  AST 26  GLUCOSE 244*   No results found for: "CKTOTAL", "CKMB", "CKMBINDEX", "TROPONINI"  Lab Results  Component Value Date   CHOL 204 (H) 08/04/2021   Lab Results  Component Value Date   HDL 41 08/04/2021   Lab Results  Component Value Date   LDLCALC UNABLE TO CALCULATE IF TRIGLYCERIDE OVER 400 mg/dL 46/96/2952   Lab Results   Component Value Date   TRIG 407 (H) 08/04/2021   Lab Results  Component Value Date   CHOLHDL 5.0 08/04/2021   Lab Results  Component Value Date   LDLDIRECT 96.0 08/04/2021      Radiology: CARDIAC CATHETERIZATION  Result Date: 08/19/2023   Prox RCA lesion is 10% stenosed.   Mid RCA lesion is 10% stenosed.   Mid LAD lesion is 75% stenosed.   Ost LAD to Prox LAD lesion is 95% stenosed.   A drug-eluting stent was successfully placed using a STENT ONYX FRONTIER 2.25X30.   A drug-eluting stent was successfully placed using a STENT ONYX FRONTIER 2.75X15.   Post intervention, there is a 0% residual stenosis.   Post intervention, there is a 0% residual stenosis.   There is mild left ventricular systolic dysfunction.   The left ventricular ejection fraction is 45-50% by visual estimate. 1.  Anterior STEMI 2.  95% stenosis ostial/proximal LAD (culprit lesion), and 75% stenosis mid-distal LAD 3.  Mildly reduced left ventricular function with estimate LVEF 45-50% with apical hypokinesis 4.  Successful primary PCI with 0.75 x 15 mm Onyx frontier DES proximal LAD, and 2.25 x 30 mm Onyx frontier DES mid-distal LAD Recommendations 1.  Dual antiplatelet therapy (aspirin and ticagrelor) uninterrupted x 1 year 2.  Resume atorvastatin 80 mg daily 3.  Resume lisinopril 20 mg daily 4.  Start metoprolol succinate 25 mg daily 5.  2D echocardiogram   DG Chest Port 1 View  Result Date: 08/19/2023 CLINICAL DATA:  Left-sided chest pain for 2 days. EXAM: PORTABLE CHEST 1 VIEW COMPARISON:  11/23/2005 FINDINGS: Defibrillator pad overlies the chest. Stable cardiomediastinal silhouette. No focal consolidation, pleural effusion, or pneumothorax. No displaced rib fractures. IMPRESSION: No active disease. Electronically Signed   By: Minerva Fester M.D.   On: 08/19/2023 01:45    EKG: Normal sinus rhythm with ST elevation in leads I, aVL, V2 through V4  ASSESSMENT AND PLAN:   1.  Anterior STEMI, with 95% stenosis  ostial/proximal LAD, and 75% stenosis mid/distal LAD, status post successful primary PCI with 2.75 x 15 mm Onyx Frontier DES ostial/proximal LAD, and 2.25 x 30 mm Onyx Frontier DES mid/distal LAD 2.  Mildly reduced left ventricular function 3.  Essential hypertension 4.  Hyperlipidemia 5.  Type 2 diabetes 6.  History of CVA  Recommendations  1.  Dual antiplatelet therapy uninterrupted x 1 year 2.  Resume atorvastatin 80 mg daily 3.  Resume lisinopril 20 mg daily 4.  Add metoprolol succinate 25 mg daily 5.  2D echocardiogram  Signed: Marcina Millard MD,PhD, [facility] 08/19/2023, 3:32 AM

## 2023-08-19 NOTE — Assessment & Plan Note (Signed)
Continue atorvastatin Plavix being replaced with Brilinta

## 2023-08-19 NOTE — Assessment & Plan Note (Signed)
S/p cardiac cath Continued management per cardiology Continue metoprolol succinate, lisinopril, atorvastatin, aspirin and Brilinta Nitroglycerin as needed chest pain Close monitoring in stepdown Cardiology consulted to follow

## 2023-08-19 NOTE — Plan of Care (Signed)
  Problem: Education: Goal: Knowledge of General Education information will improve Description: Including pain rating scale, medication(s)/side effects and non-pharmacologic comfort measures Outcome: Progressing   Problem: Health Behavior/Discharge Planning: Goal: Ability to manage health-related needs will improve Outcome: Progressing   Problem: Clinical Measurements: Goal: Ability to maintain clinical measurements within normal limits will improve Outcome: Progressing Goal: Will remain free from infection Outcome: Progressing Goal: Diagnostic test results will improve Outcome: Progressing Goal: Respiratory complications will improve Outcome: Progressing Goal: Cardiovascular complication will be avoided Outcome: Progressing   Problem: Activity: Goal: Risk for activity intolerance will decrease Outcome: Progressing   Problem: Nutrition: Goal: Adequate nutrition will be maintained Outcome: Progressing   Problem: Coping: Goal: Level of anxiety will decrease Outcome: Progressing   Problem: Elimination: Goal: Will not experience complications related to bowel motility Outcome: Progressing Goal: Will not experience complications related to urinary retention Outcome: Progressing   Problem: Pain Management: Goal: General experience of comfort will improve Outcome: Progressing   Problem: Safety: Goal: Ability to remain free from injury will improve Outcome: Progressing   Problem: Skin Integrity: Goal: Risk for impaired skin integrity will decrease Outcome: Progressing   Problem: Education: Goal: Understanding of CV disease, CV risk reduction, and recovery process will improve Outcome: Progressing Goal: Individualized Educational Video(s) Outcome: Progressing   Problem: Activity: Goal: Ability to return to baseline activity level will improve Outcome: Progressing   Problem: Cardiovascular: Goal: Ability to achieve and maintain adequate cardiovascular perfusion  will improve Outcome: Progressing Goal: Vascular access site(s) Level 0-1 will be maintained Outcome: Progressing   Problem: Health Behavior/Discharge Planning: Goal: Ability to safely manage health-related needs after discharge will improve Outcome: Progressing   Problem: Education: Goal: Ability to describe self-care measures that may prevent or decrease complications (Diabetes Survival Skills Education) will improve Outcome: Progressing Goal: Individualized Educational Video(s) Outcome: Progressing   Problem: Coping: Goal: Ability to adjust to condition or change in health will improve Outcome: Progressing   Problem: Fluid Volume: Goal: Ability to maintain a balanced intake and output will improve Outcome: Progressing   Problem: Health Behavior/Discharge Planning: Goal: Ability to identify and utilize available resources and services will improve Outcome: Progressing Goal: Ability to manage health-related needs will improve Outcome: Progressing   Problem: Metabolic: Goal: Ability to maintain appropriate glucose levels will improve Outcome: Progressing   Problem: Nutritional: Goal: Maintenance of adequate nutrition will improve Outcome: Progressing Goal: Progress toward achieving an optimal weight will improve Outcome: Progressing   Problem: Skin Integrity: Goal: Risk for impaired skin integrity will decrease Outcome: Progressing   Problem: Tissue Perfusion: Goal: Adequacy of tissue perfusion will improve Outcome: Progressing   Problem: Education: Goal: Understanding of cardiac disease, CV risk reduction, and recovery process will improve Outcome: Progressing Goal: Individualized Educational Video(s) Outcome: Progressing   Problem: Activity: Goal: Ability to tolerate increased activity will improve Outcome: Progressing   Problem: Cardiac: Goal: Ability to achieve and maintain adequate cardiovascular perfusion will improve Outcome: Progressing   Problem:  Health Behavior/Discharge Planning: Goal: Ability to safely manage health-related needs after discharge will improve Outcome: Progressing   Pt resting in bed. A&O x4. Denies pain. Remains on room air. Pt able to sit at edge of bed to void.

## 2023-08-19 NOTE — ED Notes (Signed)
Cardiologist arrived

## 2023-08-19 NOTE — Progress Notes (Signed)
PHARMACY - ANTICOAGULATION CONSULT NOTE  Pharmacy Consult for Heparin  Indication: chest pain/ACS  No Known Allergies  Patient Measurements: Height: 6' (182.9 cm) Weight: 95.3 kg (210 lb) IBW/kg (Calculated) : 77.6 Heparin Dosing Weight: 95.3 kg   Vital Signs: Temp: 98.1 F (36.7 C) (11/23 0123) Temp Source: Oral (11/23 0123) BP: 112/72 (11/23 0123) Pulse Rate: 67 (11/23 0123)  Labs: No results for input(s): "HGB", "HCT", "PLT", "APTT", "LABPROT", "INR", "HEPARINUNFRC", "HEPRLOWMOCWT", "CREATININE", "CKTOTAL", "CKMB", "TROPONINIHS" in the last 72 hours.  CrCl cannot be calculated (Patient's most recent lab result is older than the maximum 21 days allowed.).   Medical History: Past Medical History:  Diagnosis Date   Diabetes mellitus without complication (HCC)    Hypertension    Neuropathy     Medications:  (Not in a hospital admission)   Assessment: Pharmacy consulted to dose heparin in this 52 year old male admitted with ACS/NSTEMI.  No prior anticoag noted. CrCl = ?   Goal of Therapy:  Heparin level 0.3-0.7 units/ml Monitor platelets by anticoagulation protocol: Yes   Plan:  Give 4000 units bolus x 1 Start heparin infusion at 1350 units/hr Check anti-Xa level in 6 hours and daily while on heparin Continue to monitor H&H and platelets  Natale Barba D 08/19/2023,1:42 AM

## 2023-08-19 NOTE — ED Notes (Signed)
Pt transported to the cathlab by Roberts Gaudy and Cardiologist at this time.

## 2023-08-19 NOTE — ED Provider Notes (Signed)
[facility] Provider Note    Event Date/Time   First MD Initiated Contact with Patient 08/19/23 0121     (approximate)   History   Chest Pain and Abdominal Pain   HPI  Jorge Mason is a 52 y.o. male who presents to the ED from home with a chief complaint of chest pain.  Patient with a history of diabetes, hypertension and neuropathy who has not been compliant with his medications.  Reports left-sided chest tightness, waxing/waning x 2 days.  He was awakened tonight with pain associated with diaphoresis and nausea.  Reports abdominal cramping with 3 episodes of loose stools earlier tonight.  Presents to the ED pale, diaphoretic with active chest pain.  EKG concerning for STEMI.  Code STEMI called.     Past Medical History   Past Medical History:  Diagnosis Date   Diabetes mellitus without complication (HCC)    Hypertension    Neuropathy      Active Problem List   Patient Active Problem List   Diagnosis Date Noted   Left-sided weakness 08/03/2021   Stroke (HCC) 08/03/2021   Hypertension    Diabetes mellitus without complication (HCC)    Neuropathy      Past Surgical History   Past Surgical History:  Procedure Laterality Date   dental procedure       Home Medications   Prior to Admission medications   Medication Sig Start Date End Date Taking? Authorizing Provider  amitriptyline (ELAVIL) 25 MG tablet Take 25 mg by mouth at bedtime. 04/23/21   [provider]  atorvastatin (LIPITOR) 80 MG tablet Take 1 tablet (80 mg total) by mouth daily. 08/05/21 09/04/21  Lynn Ito, MD  lisinopril (ZESTRIL) 20 MG tablet Take 20 mg by mouth daily. 03/26/21   [provider]  metFORMIN (GLUCOPHAGE-XR) 500 MG 24 hr tablet Take 1,000 mg by mouth 2 (two) times daily. 03/26/21   [provider]     Allergies  Patient has no known allergies.   Family History   Family History  Problem Relation Age of Onset   Stroke Mother     Stroke Father    Heart attack Father      Physical Exam  Triage Vital Signs: ED Triage Vitals [08/19/23 0117]  Encounter Vitals Group     BP      Systolic BP Percentile      Diastolic BP Percentile      Pulse      Resp      Temp      Temp src      SpO2      Weight 210 lb (95.3 kg)     Height 6' (1.829 m)     Head Circumference      Peak Flow      Pain Score 10     Pain Loc      Pain Education      Exclude from Growth Chart     Updated Vital Signs: BP 111/72   Pulse (!) 56   Temp 98.1 F (36.7 C) (Oral)   Resp 12   Ht 6' (1.829 m)   Wt 95.3 kg   SpO2 96%   BMI 28.48 kg/m    General: Awake, moderate distress.  Pale and diaphoretic. CV:  RRR.  Good peripheral perfusion.  Resp:  Normal effort.  CTAB. Abd:  Nontender to light or deep palpation.  No abdominal bruits.  No distention.  Other:  Bilateral calves  are supple without tenderness.   ED Results / Procedures / Treatments  Labs (all labs ordered are listed, but only abnormal results are displayed) Labs Reviewed  CBC WITH DIFFERENTIAL/PLATELET  COMPREHENSIVE METABOLIC PANEL  BRAIN NATRIURETIC PEPTIDE  LIPASE, BLOOD  HEPARIN LEVEL (UNFRACTIONATED)  TROPONIN I (HIGH SENSITIVITY)     EKG  ED ECG REPORT I, [place] Brasington J, the attending physician, personally viewed and interpreted this ECG.   Date: 08/19/2023  EKG Time: 0119  Rate: 60  Rhythm: normal sinus rhythm  Axis: Normal  Intervals:none  ST&T Change: Anterior lateral STEMI  Repeat EKG from 0129 unchanged    RADIOLOGY I have independently visualized interpreted patient's imaging study as well as noted the radiology interpretation:  Chest x-ray: No acute cardiopulmonary process  Official radiology report(s): DG Chest Port 1 View  Result Date: 08/19/2023 CLINICAL DATA:  Left-sided chest pain for 2 days. EXAM: PORTABLE CHEST 1 VIEW COMPARISON:  11/23/2005 FINDINGS: Defibrillator pad overlies the chest. Stable cardiomediastinal  silhouette. No focal consolidation, pleural effusion, or pneumothorax. No displaced rib fractures. IMPRESSION: No active disease. Electronically Signed   By: Minerva Fester M.D.   On: 08/19/2023 01:45     PROCEDURES:  Critical Care performed: Yes, see critical care procedure note(s)  CRITICAL CARE Performed by: Irean Hong   Total critical care time: 30 minutes  Critical care time was exclusive of separately billable procedures and treating other patients.  Critical care was necessary to treat or prevent imminent or life-threatening deterioration.  Critical care was time spent personally by me on the following activities: development of treatment plan with patient and/or surrogate as well as nursing, discussions with consultants, evaluation of patient's response to treatment, examination of patient, obtaining history from patient or surrogate, ordering and performing treatments and interventions, ordering and review of laboratory studies, ordering and review of radiographic studies, pulse oximetry and re-evaluation of patient's condition.   Marland Kitchen1-3 Lead EKG Interpretation  Performed by: Irean Hong, MD Authorized by: Irean Hong, MD     Interpretation: normal     ECG rate:  65   ECG rate assessment: normal     Rhythm: sinus rhythm     Ectopy: none     Conduction: normal   Comments:     Patient placed on cardiac monitor to evaluate for arrhythmias    MEDICATIONS ORDERED IN ED: Medications  heparin ADULT infusion 100 units/mL (25000 units/241mL) (1,350 Units/hr Intravenous New Bag/Given 08/19/23 0148)  aspirin chewable tablet 324 mg (324 mg Oral Given 08/19/23 0134)  morphine (PF) 4 MG/ML injection 4 mg (4 mg Intravenous Given 08/19/23 0134)  ondansetron (ZOFRAN) injection 4 mg (4 mg Intravenous Given 08/19/23 0138)  heparin bolus via infusion 4,000 Units (4,000 Units Intravenous Bolus from Bag 08/19/23 0148)     IMPRESSION / MDM / ASSESSMENT AND PLAN / ED COURSE  I  reviewed the triage vital signs and the nursing notes.                             52 year old male presenting with chest pain. Differential diagnosis includes, but is not limited to, ACS, aortic dissection, pulmonary embolism, cardiac tamponade, pneumothorax, pneumonia, pericarditis, myocarditis, GI-related causes including esophagitis/gastritis, and musculoskeletal chest wall pain.   I personally reviewed patient's records and note a PCP office visit on 10/27/2022 for uncontrolled diabetes type 2.  Patient's presentation is most consistent with acute presentation with potential threat to life or bodily function.  The patient is on the cardiac monitor to evaluate for evidence of arrhythmia and/or significant heart rate changes.  Code STEMI initiated.  Patient given 4 baby aspirin, heparin bolus, morphine and Zofran for pain and nausea.  Discussed with STEMI on-call cardiologist Dr. Cassie Freer who will evaluate patient in the emergency department.  Clinical Course as of 08/19/23 1607  Sat Aug 19, 2023  0202 Patient to Cath Lab [JS]    Clinical Course User Index [JS] Irean Hong, MD     FINAL CLINICAL IMPRESSION(S) / ED DIAGNOSES   Final diagnoses:  ST elevation myocardial infarction (STEMI), unspecified artery [facility])     Rx / DC Orders   ED Discharge Orders     None        Note:  This document was prepared using Dragon voice recognition software and may include unintentional dictation errors.   Irean Hong, MD 08/19/23 [phone]

## 2023-08-19 NOTE — ED Notes (Signed)
Code stemi called per Dr. Dolores Frame to [facility] spoke with Selena Batten

## 2023-08-19 NOTE — Plan of Care (Signed)
  Problem: Education: Goal: Knowledge of General Education information will improve Description: Including pain rating scale, medication(s)/side effects and non-pharmacologic comfort measures Outcome: Progressing   Problem: Health Behavior/Discharge Planning: Goal: Ability to manage health-related needs will improve Outcome: Progressing   Problem: Clinical Measurements: Goal: Ability to maintain clinical measurements within normal limits will improve Outcome: Progressing Goal: Will remain free from infection Outcome: Progressing Goal: Diagnostic test results will improve Outcome: Progressing Goal: Respiratory complications will improve Outcome: Progressing Goal: Cardiovascular complication will be avoided Outcome: Progressing   Problem: Activity: Goal: Risk for activity intolerance will decrease Outcome: Progressing   Problem: Nutrition: Goal: Adequate nutrition will be maintained Outcome: Progressing   Problem: Coping: Goal: Level of anxiety will decrease Outcome: Progressing   Problem: Elimination: Goal: Will not experience complications related to bowel motility Outcome: Progressing Goal: Will not experience complications related to urinary retention Outcome: Progressing   Problem: Pain Management: Goal: General experience of comfort will improve Outcome: Progressing   Problem: Safety: Goal: Ability to remain free from injury will improve Outcome: Progressing   Problem: Skin Integrity: Goal: Risk for impaired skin integrity will decrease Outcome: Progressing   Problem: Education: Goal: Understanding of CV disease, CV risk reduction, and recovery process will improve Outcome: Progressing Goal: Individualized Educational Video(s) Outcome: Progressing   Problem: Activity: Goal: Ability to return to baseline activity level will improve Outcome: Progressing   Problem: Cardiovascular: Goal: Ability to achieve and maintain adequate cardiovascular perfusion  will improve Outcome: Progressing Goal: Vascular access site(s) Level 0-1 will be maintained Outcome: Progressing   Problem: Health Behavior/Discharge Planning: Goal: Ability to safely manage health-related needs after discharge will improve Outcome: Progressing   Problem: Education: Goal: Ability to describe self-care measures that may prevent or decrease complications (Diabetes Survival Skills Education) will improve Outcome: Progressing Goal: Individualized Educational Video(s) Outcome: Progressing   Problem: Coping: Goal: Ability to adjust to condition or change in health will improve Outcome: Progressing   Problem: Fluid Volume: Goal: Ability to maintain a balanced intake and output will improve Outcome: Progressing   Problem: Health Behavior/Discharge Planning: Goal: Ability to identify and utilize available resources and services will improve Outcome: Progressing Goal: Ability to manage health-related needs will improve Outcome: Progressing   Problem: Metabolic: Goal: Ability to maintain appropriate glucose levels will improve Outcome: Progressing   Problem: Nutritional: Goal: Maintenance of adequate nutrition will improve Outcome: Progressing Goal: Progress toward achieving an optimal weight will improve Outcome: Progressing   Problem: Skin Integrity: Goal: Risk for impaired skin integrity will decrease Outcome: Progressing   Problem: Tissue Perfusion: Goal: Adequacy of tissue perfusion will improve Outcome: Progressing   Problem: Education: Goal: Understanding of cardiac disease, CV risk reduction, and recovery process will improve Outcome: Progressing Goal: Individualized Educational Video(s) Outcome: Progressing   Problem: Activity: Goal: Ability to tolerate increased activity will improve Outcome: Progressing   Problem: Cardiac: Goal: Ability to achieve and maintain adequate cardiovascular perfusion will improve Outcome: Progressing   Problem:  Health Behavior/Discharge Planning: Goal: Ability to safely manage health-related needs after discharge will improve Outcome: Progressing

## 2023-08-19 NOTE — ED Triage Notes (Signed)
Pt reports L sided chest pain x 2 days. Described as sharp pressure. Pain worsened tonight and began radiating to L jaw and L hand is tingling. Associated SOB and nausea. Denies h/a, dizziness, vision change. Pt also reports abd pain with 3 episodes diarrhea earlier tonight. Pt ambulatory to triage and then placed in wheelchair. Alert and oriented. Breathing with symmetric chest rise and fall and speaking in full sentences. No diaphoresis noted.

## 2023-08-19 NOTE — ED Notes (Signed)
Cardiologist at bedside at this time.

## 2023-08-19 NOTE — Progress Notes (Signed)
[facility] Cardiology  SUBJECTIVE: Patient laying in bed, reports feeling better, mild chest heaviness   Vitals:   08/19/23 0545 08/19/23 0600 08/19/23 0615 08/19/23 0645  BP: 104/72 96/72 93/72  95/68  Pulse: 75 69 64 62  Resp: 12 10 12  (!) 9  Temp:      TempSrc:      SpO2: 95% 98% 99% 100%  Weight:      Height:         Intake/Output Summary (Last 24 hours) at 08/19/2023 0858 Last data filed at 08/19/2023 0700 Gross per 24 hour  Intake 365.9 ml  Output --  Net 365.9 ml      PHYSICAL EXAM  General: Well developed, well nourished, in no acute distress HEENT:  Normocephalic and atramatic Neck:  No JVD.  Lungs: Clear bilaterally to auscultation and percussion. Heart: HRRR . Normal S1 and S2 without gallops or murmurs.  Abdomen: Bowel sounds are positive, abdomen soft and non-tender  Msk:  Back normal, normal gait. Normal strength and tone for age. Extremities: No clubbing, cyanosis or edema.   Neuro: Alert and oriented X 3. Psych:  Good affect, responds appropriately   LABS: Basic Metabolic Panel: Recent Labs    08/19/23 0136  NA 134*  K 3.2*  CL 97*  CO2 25  GLUCOSE 244*  BUN 21*  CREATININE 1.08  CALCIUM 9.4   Liver Function Tests: Recent Labs    08/19/23 0136  AST 26  ALT 23  ALKPHOS 73  BILITOT 2.0*  PROT 7.6  ALBUMIN 4.2   Recent Labs    08/19/23 0136  LIPASE 33   CBC: Recent Labs    08/19/23 0136 08/19/23 0809  WBC 9.0 9.0  NEUTROABS 4.2  --   HGB 15.5 14.1  HCT 46.9 40.3  MCV 87.2 83.4  PLT 277 235   Cardiac Enzymes: No results for input(s): "CKTOTAL", "CKMB", "CKMBINDEX", "TROPONINI" in the last 72 hours. BNP: Invalid input(s): "POCBNP" D-Dimer: No results for input(s): "DDIMER" in the last 72 hours. Hemoglobin A1C: Recent Labs    08/19/23 0409  HGBA1C 11.6*   Fasting Lipid Panel: No results for input(s): "CHOL", "HDL", "LDLCALC", "TRIG", "CHOLHDL", "LDLDIRECT" in the last 72 hours. Thyroid Function Tests: No results for  input(s): "TSH", "T4TOTAL", "T3FREE", "THYROIDAB" in the last 72 hours.  Invalid input(s): "FREET3" Anemia Panel: No results for input(s): "VITAMINB12", "FOLATE", "FERRITIN", "TIBC", "IRON", "RETICCTPCT" in the last 72 hours.  CARDIAC CATHETERIZATION  Result Date: 08/19/2023   Prox RCA lesion is 10% stenosed.   Mid RCA lesion is 10% stenosed.   Mid LAD lesion is 75% stenosed.   Ost LAD to Prox LAD lesion is 95% stenosed.   A drug-eluting stent was successfully placed using a STENT ONYX FRONTIER 2.25X30.   A drug-eluting stent was successfully placed using a STENT ONYX FRONTIER 2.75X15.   Post intervention, there is a 0% residual stenosis.   Post intervention, there is a 0% residual stenosis.   There is mild left ventricular systolic dysfunction.   The left ventricular ejection fraction is 45-50% by visual estimate. 1.  Anterior STEMI 2.  95% stenosis ostial/proximal LAD (culprit lesion), and 75% stenosis mid-distal LAD 3.  Mildly reduced left ventricular function with estimate LVEF 45-50% with apical hypokinesis 4.  Successful primary PCI with 0.75 x 15 mm Onyx frontier DES proximal LAD, and 2.25 x 30 mm Onyx frontier DES mid-distal LAD Recommendations 1.  Dual antiplatelet therapy (aspirin and ticagrelor) uninterrupted x 1 year 2.  Resume atorvastatin  80 mg daily 3.  Resume lisinopril 20 mg daily 4.  Start metoprolol succinate 25 mg daily 5.  2D echocardiogram   DG Chest Port 1 View  Result Date: 08/19/2023 CLINICAL DATA:  Left-sided chest pain for 2 days. EXAM: PORTABLE CHEST 1 VIEW COMPARISON:  11/23/2005 FINDINGS: Defibrillator pad overlies the chest. Stable cardiomediastinal silhouette. No focal consolidation, pleural effusion, or pneumothorax. No displaced rib fractures. IMPRESSION: No active disease. Electronically Signed   By: Minerva Fester M.D.   On: 08/19/2023 01:45     Echo pending  TELEMETRY: Sinus rhythm:  ASSESSMENT AND PLAN:  Principal Problem:   Acute ST elevation myocardial  infarction (STEMI) involving left anterior descending (LAD) coronary artery (HCC) Active Problems:   Hypertension   S/P cardiac catheterization   History of CVA 07/2021(cerebrovascular accident)   Uncontrolled type 2 diabetes mellitus with hyperglycemia, without long-term current use of insulin (HCC)    1.  Anterior STEMI, with 95% stenosis ostial/proximal LAD, and 75% stenosis mid/distal LAD, status post successful primary PCI with 2.75 x 15 mm Onyx Frontier DES ostial/proximal LAD, and 2.25 x 30 mm Onyx Frontier DES mid/distal LAD 2.  Mildly reduced left ventricular function 3.  Essential hypertension 4.  Hyperlipidemia 5.  Type 2 diabetes 6.  History of CVA   Recommendations   1.  Dual antiplatelet therapy (aspirin and ticagrelor) uninterrupted x 1 year 2.  Continue atorvastatin 80 mg daily 3.  Continue lisinopril 20 mg daily 4.  Continue metoprolol succinate 25 mg daily 5.  Review 2D echocardiogram   Marcina Millard, MD, PhD, [facility] 08/19/2023 8:58 AM

## 2023-08-19 NOTE — H&P (Signed)
History and Physical    Patient: Jorge Mason:[id number] DOB: 12/03/1970 DOA: 08/19/2023 DOS: the patient was seen and examined on 08/19/2023 PCP: Pcp, No  Patient coming from: Home  Chief Complaint:  Chief Complaint  Patient presents with   Chest Pain   Abdominal Pain   HPI: Jorge Mason is a 52 y.o. male with medical history significant of HTN, DM with neuropathy, prior stroke November 2022 on Plavix and atorvastatin, who presented to the ED  with a 1 day history of intermittent chest pain. Code STEMI was called after he was found to have ST elevations anterior leads.  He was taken emergently to the Cath Lab where he received 2 stents to the LAD.  Patient was chest pain free postprocedure.  LVEF 45%.  He was placed on Brilinta due to Plavix failure.  He was transferred to stepdown in stable condition. Post cath vitals stable. Workup in ED otherwise notable for elevated blood glucose of 244.  First troponin was 220. EKG showed anterolateral STEMI. Chest x-ray nonacute  Review of Systems: As mentioned in the history of present illness. All other systems reviewed and are negative. Past Medical History:  Diagnosis Date   Diabetes mellitus without complication (HCC)    Hypertension    Neuropathy    Past Surgical History:  Procedure Laterality Date   dental procedure     Social History:  reports that he has never smoked. He has never used smokeless tobacco. He reports that he does not drink alcohol and does not use drugs.  No Known Allergies  Family History  Problem Relation Age of Onset   Stroke Mother    Stroke Father    Heart attack Father     Prior to Admission medications   Medication Sig Start Date End Date Taking? Authorizing Provider  amitriptyline (ELAVIL) 25 MG tablet Take 25 mg by mouth at bedtime. 04/23/21   [provider]  atorvastatin (LIPITOR) 80 MG tablet Take 1 tablet (80 mg total) by mouth daily. 08/05/21 09/04/21  Lynn Ito, MD   lisinopril (ZESTRIL) 20 MG tablet Take 20 mg by mouth daily. 03/26/21   [provider]  metFORMIN (GLUCOPHAGE-XR) 500 MG 24 hr tablet Take 1,000 mg by mouth 2 (two) times daily. 03/26/21   [provider]    Physical Exam: Vitals:   08/19/23 0310 08/19/23 0315 08/19/23 0320 08/19/23 0325  BP: 118/78 118/78    Pulse: 82 83 (!) 0 (!) 0  Resp: 10 16    Temp:      TempSrc:      SpO2: 98% 98%    Weight:      Height:       Physical Exam Vitals and nursing note reviewed.  Constitutional:      General: He is sleeping. He is not in acute distress. HENT:     Head: Normocephalic and atraumatic.  Cardiovascular:     Rate and Rhythm: Normal rate and regular rhythm.     Heart sounds: Normal heart sounds.  Pulmonary:     Effort: Pulmonary effort is normal.     Breath sounds: Normal breath sounds.  Abdominal:     Palpations: Abdomen is soft.     Tenderness: There is no abdominal tenderness.  Neurological:     Mental Status: He is easily aroused. Mental status is at baseline.     Data Reviewed: Relevant notes from primary care and specialist visits, past discharge summaries as available in EHR, including Care Everywhere.  Prior diagnostic testing as pertinent to current admission diagnoses Updated medications and problem lists for reconciliation ED course, including vitals, labs, imaging, treatment and response to treatment Triage notes, nursing and pharmacy notes and ED provider's notes Notable results as noted in HPI   Assessment and Plan: * Acute ST elevation myocardial infarction (STEMI) involving left anterior descending (LAD) coronary artery [facility]) S/p cardiac cath Continued management per cardiology Continue metoprolol succinate, lisinopril, atorvastatin, aspirin and Brilinta Nitroglycerin as needed chest pain Close monitoring in stepdown Cardiology consulted to follow  Uncontrolled type 2 diabetes mellitus with hyperglycemia, without long-term current use  of insulin (HCC) Blood sugar over 200 on arrival Hold home glipizide and metformin Sliding scale insulin coverage  History of CVA 07/2021(cerebrovascular accident) Continue atorvastatin Plavix being replaced with Brilinta  Hypertension Continue lisinopril, metoprolol      Advance Care Planning:   Code Status: Full Code   Consults: Cardiology [facility]  Family Communication:   Severity of Illness: The appropriate patient status for this patient is INPATIENT. Inpatient status is judged to be reasonable and necessary in order to provide the required intensity of service to ensure the patient's safety. The patient's presenting symptoms, physical exam findings, and initial radiographic and laboratory data in the context of their chronic comorbidities is felt to place them at high risk for further clinical deterioration. Furthermore, it is not anticipated that the patient will be medically stable for discharge from the hospital within 2 midnights of admission.   * I certify that at the point of admission it is my clinical judgment that the patient will require inpatient hospital care spanning beyond 2 midnights from the point of admission due to high intensity of service, high risk for further deterioration and high frequency of surveillance required.*  Author: Andris Baumann, MD 08/19/2023 3:42 AM  For on call review [email or web address].

## 2023-08-19 NOTE — Assessment & Plan Note (Signed)
Continue lisinopril, metoprolol

## 2023-08-20 ENCOUNTER — Inpatient Hospital Stay
Admit: 2023-08-20 | Discharge: 2023-08-20 | Disposition: A | Discharge status: Home / Self Care | Payer: Self-pay | Attending: Student | Admitting: Student

## 2023-08-20 LAB — ECHOCARDIOGRAM COMPLETE
AR max vel: 2.84 cm2
AV Area VTI: 2.92 cm2
AV Area mean vel: 2.5 cm2
AV Mean grad: 2 mmHg
AV Peak grad: 4.7 mmHg
Ao pk vel: 1.08 m/s
Area-P 1/2: 4.17 cm2
Calc EF: 43.6 %
Height: 72 in
MV VTI: 2.6 cm2
S' Lateral: 3.5 cm
Single Plane A2C EF: 39.3 %
Single Plane A4C EF: 44.8 %
Weight: 3276.92 [oz_av]

## 2023-08-20 LAB — CBC
HCT: 40.8 % (ref 39.0–52.0)
Hemoglobin: 13.9 g/dL (ref 13.0–17.0)
MCH: 29.1 pg (ref 26.0–34.0)
MCHC: 34.1 g/dL (ref 30.0–36.0)
MCV: 85.5 fL (ref 80.0–100.0)
Platelets: 232 K/uL (ref 150–400)
RBC: 4.77 MIL/uL (ref 4.22–5.81)
RDW: 13.2 % (ref 11.5–15.5)
WBC: 7.7 K/uL (ref 4.0–10.5)
nRBC: 0 % (ref 0.0–0.2)

## 2023-08-20 LAB — BASIC METABOLIC PANEL
Anion gap: 9 (ref 5–15)
BUN: 21 mg/dL — ABNORMAL HIGH (ref 6–20)
CO2: 23 mmol/L (ref 22–32)
Calcium: 8.5 mg/dL — ABNORMAL LOW (ref 8.9–10.3)
Chloride: 102 mmol/L (ref 98–111)
Creatinine, Ser: 1.13 mg/dL (ref 0.61–1.24)
GFR, Estimated: >60 mL/min (ref >60)
Glucose, Bld: 204 mg/dL — ABNORMAL HIGH (ref 70–99)
Potassium: 4 mmol/L (ref 3.5–5.1)
Sodium: 134 mmol/L — ABNORMAL LOW (ref 135–145)

## 2023-08-20 LAB — PHOSPHORUS: Phosphorus: 3.4 mg/dL (ref 2.5–4.6)

## 2023-08-20 LAB — GLUCOSE, CAPILLARY
Glucose-Capillary: 200 mg/dL — ABNORMAL HIGH (ref 70–99)
Glucose-Capillary: 236 mg/dL — ABNORMAL HIGH (ref 70–99)

## 2023-08-20 LAB — MAGNESIUM: Magnesium: 1.7 mg/dL (ref 1.7–2.4)

## 2023-08-20 MED ORDER — TICAGRELOR 90 MG PO TABS: 90.0000 mg | ORAL_TABLET | Freq: Two times a day (BID) | ORAL | 3 refills | Status: AC

## 2023-08-20 MED ORDER — ATORVASTATIN CALCIUM 80 MG PO TABS: 80.0000 mg | ORAL_TABLET | Freq: Every day | ORAL | 11 refills | Status: AC

## 2023-08-20 MED ORDER — ASPIRIN 81 MG PO CHEW: 81.0000 mg | CHEWABLE_TABLET | Freq: Every day | ORAL | 3 refills | Status: AC

## 2023-08-20 MED ORDER — METOPROLOL SUCCINATE ER 25 MG PO TB24: 25.0000 mg | ORAL_TABLET | Freq: Every day | ORAL | 3 refills | Status: DC

## 2023-08-20 MED ORDER — TAMSULOSIN HCL 0.4 MG PO CAPS: 0.4000 mg | ORAL_CAPSULE | Freq: Every day | ORAL | 2 refills | Status: AC

## 2023-08-20 NOTE — Progress Notes (Signed)
[facility] Cardiology  SUBJECTIVE: Patient laying in bed, denies chest pain or shortness of breath   Vitals:   08/20/23 0330 08/20/23 0400 08/20/23 0500 08/20/23 0600  BP:  101/70  106/78  Pulse: 84 80 66 75  Resp: 14 (!) 9 11 (!) 9  Temp: 97.8 F (36.6 C)     TempSrc: Oral     SpO2: 95% 97% 94% 96%  Weight: 92.9 kg     Height:         Intake/Output Summary (Last 24 hours) at 08/20/2023 2841 Last data filed at 08/19/2023 2350 Gross per 24 hour  Intake 1901.2 ml  Output 2100 ml  Net -198.8 ml      PHYSICAL EXAM  General: Well developed, well nourished, in no acute distress HEENT:  Normocephalic and atramatic Neck:  No JVD.  Lungs: Clear bilaterally to auscultation and percussion. Heart: HRRR . Normal S1 and S2 without gallops or murmurs.  Abdomen: Bowel sounds are positive, abdomen soft and non-tender  Msk:  Back normal, normal gait. Normal strength and tone for age. Extremities: No clubbing, cyanosis or edema.   Neuro: Alert and oriented X 3. Psych:  Good affect, responds appropriately   LABS: Basic Metabolic Panel: Recent Labs    08/19/23 0136 08/19/23 0809  NA 134* 132*  K 3.2* 4.3  CL 97* 100  CO2 25 23  GLUCOSE 244* 252*  BUN 21* 19  CREATININE 1.08 1.04  CALCIUM 9.4 9.0  MG  --  1.7  PHOS  --  3.6   Liver Function Tests: Recent Labs    08/19/23 0136  AST 26  ALT 23  ALKPHOS 73  BILITOT 2.0*  PROT 7.6  ALBUMIN 4.2   Recent Labs    08/19/23 0136  LIPASE 33   CBC: Recent Labs    08/19/23 0136 08/19/23 0809  WBC 9.0 9.0  NEUTROABS 4.2  --   HGB 15.5 14.1  HCT 46.9 40.3  MCV 87.2 83.4  PLT 277 235   Cardiac Enzymes: No results for input(s): "CKTOTAL", "CKMB", "CKMBINDEX", "TROPONINI" in the last 72 hours. BNP: Invalid input(s): "POCBNP" D-Dimer: No results for input(s): "DDIMER" in the last 72 hours. Hemoglobin A1C: Recent Labs    08/19/23 0409  HGBA1C 11.6*   Fasting Lipid Panel: No results for input(s): "CHOL", "HDL",  "LDLCALC", "TRIG", "CHOLHDL", "LDLDIRECT" in the last 72 hours. Thyroid Function Tests: No results for input(s): "TSH", "T4TOTAL", "T3FREE", "THYROIDAB" in the last 72 hours.  Invalid input(s): "FREET3" Anemia Panel: No results for input(s): "VITAMINB12", "FOLATE", "FERRITIN", "TIBC", "IRON", "RETICCTPCT" in the last 72 hours.  CARDIAC CATHETERIZATION  Result Date: 08/19/2023   Prox RCA lesion is 10% stenosed.   Mid RCA lesion is 10% stenosed.   Mid LAD lesion is 75% stenosed.   Ost LAD to Prox LAD lesion is 95% stenosed.   A drug-eluting stent was successfully placed using a STENT ONYX FRONTIER 2.25X30.   A drug-eluting stent was successfully placed using a STENT ONYX FRONTIER 2.75X15.   Post intervention, there is a 0% residual stenosis.   Post intervention, there is a 0% residual stenosis.   There is mild left ventricular systolic dysfunction.   The left ventricular ejection fraction is 45-50% by visual estimate. 1.  Anterior STEMI 2.  95% stenosis ostial/proximal LAD (culprit lesion), and 75% stenosis mid-distal LAD 3.  Mildly reduced left ventricular function with estimate LVEF 45-50% with apical hypokinesis 4.  Successful primary PCI with 0.75 x 15 mm Onyx frontier DES  proximal LAD, and 2.25 x 30 mm Onyx frontier DES mid-distal LAD Recommendations 1.  Dual antiplatelet therapy (aspirin and ticagrelor) uninterrupted x 1 year 2.  Resume atorvastatin 80 mg daily 3.  Resume lisinopril 20 mg daily 4.  Start metoprolol succinate 25 mg daily 5.  2D echocardiogram   DG Chest Port 1 View  Result Date: 08/19/2023 CLINICAL DATA:  Left-sided chest pain for 2 days. EXAM: PORTABLE CHEST 1 VIEW COMPARISON:  11/23/2005 FINDINGS: Defibrillator pad overlies the chest. Stable cardiomediastinal silhouette. No focal consolidation, pleural effusion, or pneumothorax. No displaced rib fractures. IMPRESSION: No active disease. Electronically Signed   By: Minerva Fester M.D.   On: 08/19/2023 01:45     Echo  pending  TELEMETRY: Sinus rhythm:  ASSESSMENT AND PLAN:  Principal Problem:   Acute ST elevation myocardial infarction (STEMI) involving left anterior descending (LAD) coronary artery (HCC) Active Problems:   Hypertension   S/P cardiac catheterization   History of CVA 07/2021(cerebrovascular accident)   Uncontrolled type 2 diabetes mellitus with hyperglycemia, without long-term current use of insulin (HCC)    1.  Anterior STEMI, with 95% stenosis ostial/proximal LAD, and 75% stenosis mid/distal LAD, status post successful primary PCI with 2.75 x 15 mm Onyx Frontier DES ostial/proximal LAD, and 2.25 x 30 mm Onyx Frontier DES mid/distal LAD 2.  Mildly reduced left ventricular function 3.  Essential hypertension 4.  Hyperlipidemia 5.  Type 2 diabetes 6.  History of CVA   Recommendations   1.  Dual antiplatelet therapy (aspirin and ticagrelor) uninterrupted x 1 year 2.  Continue atorvastatin 80 mg daily 3.  Continue lisinopril 20 mg daily 4.  Continue metoprolol succinate 25 mg daily 5.  Review 2D echocardiogram 6.  Probable discharge home later today 7.  Follow-up with me 1 to 2 weeks   Marcina Millard, MD, PhD, [facility] 08/20/2023 8:32 AM

## 2023-08-20 NOTE — Discharge Summary (Signed)
[facility] Discharge Summary   Patient: Jorge Mason ZOX:[id number]  PCP: Pcp, No  Date of admission: 08/19/2023   Date of discharge: 08/20/2023     Discharge Diagnoses:  Principal Problem:   Acute ST elevation myocardial infarction (STEMI) involving left anterior descending (LAD) coronary artery (HCC) Active Problems:   S/P cardiac catheterization   Uncontrolled type 2 diabetes mellitus with hyperglycemia, without long-term current use of insulin (HCC)   Hypertension   History of CVA 07/2021(cerebrovascular accident)   Admitted From: Home Disposition:  Home   Recommendations for Outpatient Follow-up:  Follow-up with PCP in 1 week Follow-up with cardiology in 1 to 2 weeks Follow-up with urology in 1 to 2 weeks Follow up LABS/TEST:     Follow-up Information     Paraschos, Alexander, MD Follow up in 2 week(s).   Specialty: Cardiology Contact information: [address] [facility] [place] [address] [phone]         PCP Follow up in 1 week(s).          Sondra Come, MD Follow up in 1 week(s).   Specialty: Urology Contact information: [address] [place] [address] [phone]                Diet recommendation: Cardiac and Carb modified diet  Activity: The patient is advised to gradually reintroduce usual activities, as tolerated  Discharge Condition: stable  Code Status: Full code   History of present illness: As per the H and P dictated on admission Hospital Course:  [name] is a 52 y.o. male with medical history significant of HTN, DM with neuropathy, prior stroke November 2022 on Plavix and atorvastatin, who presented to the ED  with a 1 day history of intermittent chest pain. Code STEMI was called after he was found to have ST elevations anterior leads.  He was taken emergently to the Cath Lab where he received 2 stents to the LAD.  Patient was chest pain free postprocedure.   LVEF 45%.  He was placed on Brilinta due to Plavix failure.  He was transferred to stepdown in stable condition. Post cath vitals stable. Workup in ED otherwise notable for elevated blood glucose of 244.  First troponin was 220. EKG showed anterolateral STEMI. Chest x-ray nonacute   Assessment and Plan: # Acute anterior STEMI  S/p cardiac cath: 95% stenosis ostial/proximal LAD, and 75% stenosis mid/distal LAD, status post successful primary PCI with 2.75 x 15 mm Onyx Frontier DES ostial/proximal LAD, and 2.25 x 30 mm Onyx Frontier DES mid/distal LAD Cardiology recommended DAPT aspirin and Brilinta for 1 year, Lipitor 80 mg p.o. daily, lisinopril 20 mg p.o. daily, metoprolol succinate 25 mg p.o. daily.  TTE LVEF 45 to 50%, LV demonstrates wall motion abnormality grade 1 diastolic dysfunction. Patient's symptoms resolved, no more chest pain, ambulated well.  Patient was cleared by cardiology to discharge home and follow-up as an outpatient.  Patient agreed with the discharge planning.   # NIDDM T2, hyperglycemia, s/p NovoLog sliding scale during hospital stay. resumed metformin on discharge.  Patient was advised to monitor CBG, continue diabetic diet.  Follow with PCP for further management as an outpatient.   # History of CVA 07/2021(cerebrovascular accident) Continue atorvastatin, started aspirin and Brilinta # Hypertension: Continue lisinopril, metoprolol # Dysuria, difficulty urinating possibly due to enlarged prostate Started Flomax 0.4 mg p.o. daily Patient was advised to follow-up with urology as an outpatient.   Body mass index is 27.78  kg/m.  Nutrition Interventions:  Patient was ambulatory without any assistance. On the day of the discharge the patient's vitals were stable, and no other acute medical condition were reported by patient. the patient was felt safe to be discharge at Home.  Consultants: Cardiology Procedures: Cardiac cath s/p stent insertion as above  Discharge  Exam: General: Appear in no distress, no Rash; Oral Mucosa Clear, moist. Cardiovascular: S1 and S2 Present, no Murmur, Respiratory: normal respiratory effort, Bilateral Air entry present and no Crackles, no wheezes Abdomen: Bowel Sound present, Soft and no tenderness, no hernia Extremities: no Pedal edema, no calf tenderness Neurology: alert and oriented to time, place, and person affect appropriate.  Filed Weights   08/19/23 0117 08/19/23 0343 08/20/23 0330  Weight: 95.3 kg 90.4 kg 92.9 kg   Vitals:   08/20/23 0800 08/20/23 1135  BP: 110/79 113/80  Pulse: 76 83  Resp: (!) 9 15  Temp: 97.8 F (36.6 C) 97.9 F (36.6 C)  SpO2: 96% 96%    DISCHARGE MEDICATION: Allergies as of 08/20/2023   No Known Allergies      Medication List     TAKE these medications    amitriptyline 25 MG tablet Commonly known as: ELAVIL Take 25 mg by mouth at bedtime.   aspirin 81 MG chewable tablet Chew 1 tablet (81 mg total) by mouth daily.   atorvastatin 80 MG tablet Commonly known as: LIPITOR Take 1 tablet (80 mg total) by mouth daily.   lisinopril 20 MG tablet Commonly known as: ZESTRIL Take 20 mg by mouth daily.   metFORMIN 500 MG 24 hr tablet Commonly known as: GLUCOPHAGE-XR Take 1,000 mg by mouth 2 (two) times daily.   metoprolol succinate 25 MG 24 hr tablet Commonly known as: TOPROL-XL Take 1 tablet (25 mg total) by mouth daily. Take with or immediately following a meal.   tamsulosin 0.4 MG Caps capsule Commonly known as: FLOMAX Take 1 capsule (0.4 mg total) by mouth daily after supper.   ticagrelor 90 MG Tabs tablet Commonly known as: BRILINTA Take 1 tablet (90 mg total) by mouth 2 (two) times daily.       No Known Allergies Discharge Instructions     AMB Referral to Cardiac Rehabilitation - Phase II   Complete by: As directed    Diagnosis:  Coronary Stents STEMI     After initial evaluation and assessments completed: Virtual Based Care may be provided alone or  in conjunction with Phase 2 Cardiac Rehab based on patient barriers.: Yes   Intensive Cardiac Rehabilitation (ICR) [facility] location only OR Traditional Cardiac Rehabilitation (TCR) *If criteria for ICR are not met will enroll in TCR [facility] only): Yes   Call MD for:  difficulty breathing, headache or visual disturbances   Complete by: As directed    Call MD for:  extreme fatigue   Complete by: As directed    Call MD for:  persistant dizziness or light-headedness   Complete by: As directed    Call MD for:  persistant nausea and vomiting   Complete by: As directed    Call MD for:  severe uncontrolled pain   Complete by: As directed    Call MD for:  temperature >100.4   Complete by: As directed    Diet - low sodium heart healthy   Complete by: As directed    Discharge instructions   Complete by: As directed    Follow-up with PCP in 1 week Follow-up with cardiology in 1 to 2 weeks  Follow-up with urology in 1 to 2 weeks   Increase activity slowly   Complete by: As directed        The results of significant diagnostics from this hospitalization (including imaging, microbiology, ancillary and laboratory) are listed below for reference.    Significant Diagnostic Studies: CARDIAC CATHETERIZATION  Result Date: 08/19/2023   Prox RCA lesion is 10% stenosed.   Mid RCA lesion is 10% stenosed.   Mid LAD lesion is 75% stenosed.   Ost LAD to Prox LAD lesion is 95% stenosed.   A drug-eluting stent was successfully placed using a STENT ONYX FRONTIER 2.25X30.   A drug-eluting stent was successfully placed using a STENT ONYX FRONTIER 2.75X15.   Post intervention, there is a 0% residual stenosis.   Post intervention, there is a 0% residual stenosis.   There is mild left ventricular systolic dysfunction.   The left ventricular ejection fraction is 45-50% by visual estimate. 1.  Anterior STEMI 2.  95% stenosis ostial/proximal LAD (culprit lesion), and 75% stenosis mid-distal LAD 3.  Mildly reduced left ventricular  function with estimate LVEF 45-50% with apical hypokinesis 4.  Successful primary PCI with 0.75 x 15 mm Onyx frontier DES proximal LAD, and 2.25 x 30 mm Onyx frontier DES mid-distal LAD Recommendations 1.  Dual antiplatelet therapy (aspirin and ticagrelor) uninterrupted x 1 year 2.  Resume atorvastatin 80 mg daily 3.  Resume lisinopril 20 mg daily 4.  Start metoprolol succinate 25 mg daily 5.  2D echocardiogram   DG Chest Port 1 View  Result Date: 08/19/2023 CLINICAL DATA:  Left-sided chest pain for 2 days. EXAM: PORTABLE CHEST 1 VIEW COMPARISON:  11/23/2005 FINDINGS: Defibrillator pad overlies the chest. Stable cardiomediastinal silhouette. No focal consolidation, pleural effusion, or pneumothorax. No displaced rib fractures. IMPRESSION: No active disease. Electronically Signed   By: Minerva Fester M.D.   On: 08/19/2023 01:45    Microbiology: Recent Results (from the past 240 hour(s))  MRSA Next Gen by PCR, Nasal     Status: None   Collection Time: 08/19/23  3:36 AM   Specimen: Nasal Mucosa; Nasal Swab  Result Value Ref Range Status   MRSA by PCR Next Gen NOT DETECTED NOT DETECTED Final    Comment: (NOTE) The GeneXpert MRSA Assay (FDA approved for NASAL specimens only), is one component of a comprehensive MRSA colonization surveillance program. It is not intended to diagnose MRSA infection nor to guide or monitor treatment for MRSA infections. Test performance is not FDA approved in patients less than 20 years old. Performed at [facility], [address] [place], [place] [address]      Labs: CBC: Recent Labs  Lab 08/19/23 0136 08/19/23 0809 08/20/23 0823  WBC 9.0 9.0 7.7  NEUTROABS 4.2  --   --   HGB 15.5 14.1 13.9  HCT 46.9 40.3 40.8  MCV 87.2 83.4 85.5  PLT 277 235 232   Basic Metabolic Panel: Recent Labs  Lab 08/19/23 0136 08/19/23 0809 08/20/23 0823  NA 134* 132* 134*  K 3.2* 4.3 4.0  CL 97* 100 102  CO2 25 23 23   GLUCOSE 244* 252* 204*  BUN 21* 19  21*  CREATININE 1.08 1.04 1.13  CALCIUM 9.4 9.0 8.5*  MG  --  1.7 1.7  PHOS  --  3.6 3.4   Liver Function Tests: Recent Labs  Lab 08/19/23 0136  AST 26  ALT 23  ALKPHOS 73  BILITOT 2.0*  PROT 7.6  ALBUMIN 4.2   Recent  Labs  Lab 08/19/23 0136  LIPASE 33   No results for input(s): "AMMONIA" in the last 168 hours. Cardiac Enzymes: No results for input(s): "CKTOTAL", "CKMB", "CKMBINDEX", "TROPONINI" in the last 168 hours. BNP (last 3 results) Recent Labs    08/19/23 0136  BNP 28.7   CBG: Recent Labs  Lab 08/19/23 1126 08/19/23 1614 08/19/23 2111 08/20/23 0726 08/20/23 1203  GLUCAP 251* 187* 211* 200* 236*    Time spent: 35 minutes  Signed:  Gillis Santa  [facility] 08/20/2023 12:40 PM

## 2023-08-20 NOTE — Progress Notes (Signed)
This nurse called the patient at number listed on chart. The patient was given instructions given by Dr Cassie Freer "4 tablets 300 mg tomorrow morning and then Plavix 75 mg daily". Patient had pen and paper, took note of instructions given and read back to this nurse. This nurse instructed patient to call 911 for shortness of breath or chest pain. Patient verbalized understanding.

## 2023-08-20 NOTE — Discharge Instructions (Signed)
You are encouraged to [facility] and arrange an application appointment to be screened for service to get set up with a physician for primary care  You may print the application and take with you to the appointment. At this web address [email or web address] Please Call [facility] for appointment  [phone]  [address] [place], [place] [address]  Office Hours Monday: Closed Tuesday: 9:00am - 4:00pm Wednesday: 9:00am - 4:00pm Thursday: 9:00am - 8:00pm Friday: Closed  Endocrinology 2nd Thursday of the Month: 5:00pm - 8:00pm  Rheumatology/Orthopedic Clinic By appointment only.  Contact for availability.     Services Not Covered Obstetrics Gastrointestinal/Liver Disease

## 2023-08-20 NOTE — Progress Notes (Signed)
  Echocardiogram 2D Echocardiogram has been performed.  Jorge Mason 08/20/2023, 12:50 PM

## 2023-08-21 ENCOUNTER — Encounter: Payer: Self-pay | Admitting: Cardiology

## 2023-08-21 LAB — POCT ACTIVATED CLOTTING TIME
Activated Clotting Time: 245 s
Activated Clotting Time: 256 s

## 2023-08-22 LAB — LIPOPROTEIN A (LPA): Lipoprotein (a): 68.9 nmol/L — ABNORMAL HIGH (ref <75.0)

## 2023-12-13 ENCOUNTER — Other Ambulatory Visit: Payer: Self-pay

## 2023-12-13 ENCOUNTER — Emergency Department: Payer: Self-pay

## 2023-12-13 ENCOUNTER — Emergency Department
Admission: EM | Admit: 2023-12-13 | Discharge: 2023-12-13 | Disposition: A | Discharge status: Home / Self Care | Payer: Self-pay | Attending: Emergency Medicine | Admitting: Emergency Medicine

## 2023-12-13 DIAGNOSIS — K859 Acute pancreatitis without necrosis or infection, unspecified: Secondary | ICD-10-CM | POA: Insufficient documentation

## 2023-12-13 DIAGNOSIS — D649 Anemia, unspecified: Secondary | ICD-10-CM | POA: Insufficient documentation

## 2023-12-13 DIAGNOSIS — I10 Essential (primary) hypertension: Secondary | ICD-10-CM | POA: Insufficient documentation

## 2023-12-13 DIAGNOSIS — E119 Type 2 diabetes mellitus without complications: Secondary | ICD-10-CM | POA: Insufficient documentation

## 2023-12-13 DIAGNOSIS — R109 Unspecified abdominal pain: Secondary | ICD-10-CM

## 2023-12-13 DIAGNOSIS — N210 Calculus in bladder: Secondary | ICD-10-CM | POA: Insufficient documentation

## 2023-12-13 LAB — CBC
HCT: 37.2 % — ABNORMAL LOW (ref 39.0–52.0)
Hemoglobin: 12.2 g/dL — ABNORMAL LOW (ref 13.0–17.0)
MCH: 30.4 pg (ref 26.0–34.0)
MCHC: 32.8 g/dL (ref 30.0–36.0)
MCV: 92.8 fL (ref 80.0–100.0)
Platelets: 243 10*3/uL (ref 150–400)
RBC: 4.01 MIL/uL — ABNORMAL LOW (ref 4.22–5.81)
RDW: 12.8 % (ref 11.5–15.5)
WBC: 9.6 10*3/uL (ref 4.0–10.5)
nRBC: 0 % (ref 0.0–0.2)

## 2023-12-13 LAB — COMPREHENSIVE METABOLIC PANEL
ALT: 24 U/L (ref 0–44)
AST: 28 U/L (ref 15–41)
Albumin: 3.8 g/dL (ref 3.5–5.0)
Alkaline Phosphatase: 49 U/L (ref 38–126)
Anion gap: 11 (ref 5–15)
BUN: 14 mg/dL (ref 6–20)
CO2: 24 mmol/L (ref 22–32)
Calcium: 8.8 mg/dL — ABNORMAL LOW (ref 8.9–10.3)
Chloride: 105 mmol/L (ref 98–111)
Creatinine, Ser: 1.03 mg/dL (ref 0.61–1.24)
GFR, Estimated: >60 mL/min (ref >60)
Glucose, Bld: 177 mg/dL — ABNORMAL HIGH (ref 70–99)
Potassium: 3.7 mmol/L (ref 3.5–5.1)
Sodium: 140 mmol/L (ref 135–145)
Total Bilirubin: 1.5 mg/dL — ABNORMAL HIGH (ref 0.0–1.2)
Total Protein: 7.1 g/dL (ref 6.5–8.1)

## 2023-12-13 LAB — URINALYSIS, ROUTINE W REFLEX MICROSCOPIC
Bilirubin Urine: NEGATIVE
Glucose, UA: NEGATIVE mg/dL
Hgb urine dipstick: NEGATIVE
Ketones, ur: NEGATIVE mg/dL
Leukocytes,Ua: NEGATIVE
Nitrite: NEGATIVE
Protein, ur: NEGATIVE mg/dL
Specific Gravity, Urine: 1.02 (ref 1.005–1.030)
pH: 5 (ref 5.0–8.0)

## 2023-12-13 LAB — LIPASE, BLOOD: Lipase: 231 U/L — ABNORMAL HIGH (ref 11–51)

## 2023-12-13 MED ORDER — SODIUM CHLORIDE 0.9 % IV BOLUS
1000.0000 mL | Freq: Once | INTRAVENOUS | Status: AC
  Administered 2023-12-13: 1000 mL via INTRAVENOUS

## 2023-12-13 MED ORDER — HYDROCODONE-ACETAMINOPHEN 5-325 MG PO TABS: 1.0000 | ORAL_TABLET | Freq: Four times a day (QID) | ORAL | 0 refills | Status: AC | PRN

## 2023-12-13 MED ORDER — ONDANSETRON HCL 4 MG PO TABS: 4.0000 mg | ORAL_TABLET | Freq: Four times a day (QID) | ORAL | 0 refills | Status: AC | PRN

## 2023-12-13 MED ORDER — ONDANSETRON HCL 4 MG/2ML IJ SOLN
4.0000 mg | Freq: Once | INTRAMUSCULAR | Status: AC
  Administered 2023-12-13: 4 mg via INTRAVENOUS
  Filled 2023-12-13: qty 2

## 2023-12-13 MED ORDER — MORPHINE SULFATE (PF) 4 MG/ML IV SOLN
4.0000 mg | Freq: Once | INTRAVENOUS | Status: AC
  Administered 2023-12-13: 4 mg via INTRAVENOUS
  Filled 2023-12-13: qty 1

## 2023-12-13 MED ORDER — HYDROCODONE-ACETAMINOPHEN 5-325 MG PO TABS
1.0000 | ORAL_TABLET | Freq: Once | ORAL | Status: AC
  Administered 2023-12-13: 1 via ORAL
  Filled 2023-12-13: qty 1

## 2023-12-13 NOTE — Discharge Instructions (Addendum)
 You were seen in the ER today for abdominal pain and nausea.  Your CT was overall reassuring, but your blood work was concerning for inflammation of your pancreas noticed pancreatitis.  I sent a prescription for nausea medication and pain medication to your pharmacy.  The pain medication can make you drowsy.  Do not drive or have machinery when taking this.  Return to the ER for new or worsening symptoms including inability tolerate food or liquids despite nausea medication, uncontrolled pain, or any other new or concerning symptoms.  Otherwise, follow with your primary care doctor for further evaluation.

## 2023-12-13 NOTE — ED Notes (Addendum)
Pt given crackers and ice water for PO trial.

## 2023-12-13 NOTE — ED Triage Notes (Signed)
 Pt to ED via POV from home. Pt reports was woken up by left lower back pain. Pt reports pain is now radiating to left groin. Pt reports nausea due to pain. Pt reports decrease UO

## 2023-12-13 NOTE — ED Provider Notes (Signed)
 [facility] Provider Note    Event Date/Time   First MD Initiated Contact with Patient 12/13/23 [phone]     (approximate)   History   Back Pain   HPI  Jorge Mason is a 53 year old male with history of HTN, DM, CVA presenting to the ER for evaluation of left flank pain.  This morning, patient had onset of left flank pain with associated nausea.  No history of similar.  No history of renal stones.  Pain radiates into his groin.  Does report some dysuria.  No fevers.      Physical Exam   Triage Vital Signs: ED Triage Vitals [12/13/23 0756]  Encounter Vitals Group     BP (!) 149/100     Systolic BP Percentile      Diastolic BP Percentile      Pulse Rate 65     Resp 20     Temp 98.1 F (36.7 C)     Temp Source Oral     SpO2 98 %     Weight      Height      Head Circumference      Peak Flow      Pain Score 8     Pain Loc      Pain Education      Exclude from Growth Chart     Most recent vital signs: Vitals:   12/13/23 1130 12/13/23 1200  BP: 125/77 114/79  Pulse: 82 67  Resp:  18  Temp:  98 F (36.7 C)  SpO2: 94% 95%     General: Awake, interactive  CV:  Regular rate, good peripheral perfusion.  Resp:  Unlabored respirations, lungs clear to auscultation Abd:  Nondistended, soft, appears uncomfortable but no significant tenderness to palpation Neuro:  Symmetric facial movement, fluid speech   ED Results / Procedures / Treatments   Labs (all labs ordered are listed, but only abnormal results are displayed) Labs Reviewed  LIPASE, BLOOD - Abnormal; Notable for the following components:      Result Value   Lipase 231 (*)    All other components within normal limits  COMPREHENSIVE METABOLIC PANEL - Abnormal; Notable for the following components:   Glucose, Bld 177 (*)    Calcium 8.8 (*)    Total Bilirubin 1.5 (*)    All other components within normal limits  CBC - Abnormal; Notable for the following components:   RBC 4.01  (*)    Hemoglobin 12.2 (*)    HCT 37.2 (*)    All other components within normal limits  URINALYSIS, ROUTINE W REFLEX MICROSCOPIC - Abnormal; Notable for the following components:   Color, Urine YELLOW (*)    APPearance CLEAR (*)    All other components within normal limits     EKG EKG independently reviewed interpreted by myself (ER attending) demonstrates:    RADIOLOGY Imaging independently reviewed and interpreted by myself demonstrates:  CT demonstrates 2 mm bladder stone without evidence of hydronephrosis  PROCEDURES:  Critical Care performed: No  Procedures   MEDICATIONS ORDERED IN ED: Medications  ondansetron (ZOFRAN) injection 4 mg (4 mg Intravenous Given 12/13/23 0859)  sodium chloride 0.9 % bolus 1,000 mL (1,000 mLs Intravenous New Bag/Given 12/13/23 0859)  morphine (PF) 4 MG/ML injection 4 mg (4 mg Intravenous Given 12/13/23 0859)  HYDROcodone-acetaminophen (NORCO/VICODIN) 5-325 MG per tablet 1 tablet (1 tablet Oral Given 12/13/23 1219)     IMPRESSION / MDM / ASSESSMENT AND PLAN /  ED COURSE  I reviewed the triage vital signs and the nursing notes.  Differential diagnosis includes, but is not limited to, renal stone, UTI, colitis, diverticulitis, other acute intra-abdominal process  Patient's presentation is most consistent with acute presentation with potential threat to life or bodily function.  53 year old male presenting with flank pain.  Stable vitals on presentation.  Will obtain labs, CT to further evaluate.  Morphine, Zofran, IV fluids ordered for symptomatic treatment.  2:07 PM CT demonstrated 2 mm bladder stone, no other acute findings.  Labs with mild anemia without acute bleeding source.  CMP overall reassuring.  Urine without evidence of infection.  Lipase did return elevated at 231 consistent with pancreatitis.  Patient does have some abdominal pain and nausea, though clinical history is overall somewhat atypical for pancreatitis.  Differential for  pain includes recently passed stone versus developing pancreatitis.  I did discuss possible admission in the setting of his elevated lipase.  However, after fluids, nausea medication, pain medication, patient feels much improved.  He was able to tolerate p.o. intake.  He would prefer to trial discharge with outpatient management.  Given his improved symptoms, do think this is reasonable.  Will DC with prescription for nausea medication and pain medication.  Strict return precautions provided.  Patient discharged stable condition.      FINAL CLINICAL IMPRESSION(S) / ED DIAGNOSES   Final diagnoses:  Left flank pain  Acute pancreatitis, unspecified complication status, unspecified pancreatitis type  Bladder stone     Rx / DC Orders   ED Discharge Orders          Ordered    ondansetron (ZOFRAN) 4 MG tablet  Every 6 hours PRN        12/13/23 1407    HYDROcodone-acetaminophen (NORCO/VICODIN) 5-325 MG tablet  Every 6 hours PRN        12/13/23 1407             Note:  This document was prepared using Dragon voice recognition software and may include unintentional dictation errors.   Trinna Post, MD 12/13/23 [phone]

## 2024-05-22 ENCOUNTER — Emergency Department
Admission: EM | Admit: 2024-05-22 | Discharge: 2024-05-22 | Disposition: A | Discharge status: Home / Self Care | Payer: Self-pay | Attending: Emergency Medicine | Admitting: Emergency Medicine

## 2024-05-22 ENCOUNTER — Emergency Department: Payer: Self-pay

## 2024-05-22 ENCOUNTER — Other Ambulatory Visit: Payer: Self-pay

## 2024-05-22 DIAGNOSIS — Z8673 Personal history of transient ischemic attack (TIA), and cerebral infarction without residual deficits: Secondary | ICD-10-CM | POA: Insufficient documentation

## 2024-05-22 DIAGNOSIS — R202 Paresthesia of skin: Secondary | ICD-10-CM | POA: Insufficient documentation

## 2024-05-22 LAB — COMPREHENSIVE METABOLIC PANEL WITH GFR
ALT: 22 U/L (ref 0–44)
AST: 26 U/L (ref 15–41)
Albumin: 3.6 g/dL (ref 3.5–5.0)
Alkaline Phosphatase: 66 U/L (ref 38–126)
Anion gap: 10 (ref 5–15)
BUN: 20 mg/dL (ref 6–20)
CO2: 27 mmol/L (ref 22–32)
Calcium: 9 mg/dL (ref 8.9–10.3)
Chloride: 101 mmol/L (ref 98–111)
Creatinine, Ser: 1.06 mg/dL (ref 0.61–1.24)
GFR, Estimated: >60 mL/min (ref >60)
Glucose, Bld: 211 mg/dL — ABNORMAL HIGH (ref 70–99)
Potassium: 4.3 mmol/L (ref 3.5–5.1)
Sodium: 138 mmol/L (ref 135–145)
Total Bilirubin: 1.9 mg/dL — ABNORMAL HIGH (ref 0.0–1.2)
Total Protein: 6.8 g/dL (ref 6.5–8.1)

## 2024-05-22 LAB — CBC
HCT: 38.6 % — ABNORMAL LOW (ref 39.0–52.0)
Hemoglobin: 12.9 g/dL — ABNORMAL LOW (ref 13.0–17.0)
MCH: 30.3 pg (ref 26.0–34.0)
MCHC: 33.4 g/dL (ref 30.0–36.0)
MCV: 90.6 fL (ref 80.0–100.0)
Platelets: 246 K/uL (ref 150–400)
RBC: 4.26 MIL/uL (ref 4.22–5.81)
RDW: 12.1 % (ref 11.5–15.5)
WBC: 8 K/uL (ref 4.0–10.5)
nRBC: 0 % (ref 0.0–0.2)

## 2024-05-22 LAB — PROTIME-INR
INR: 1 (ref 0.8–1.2)
Prothrombin Time: 13.3 s (ref 11.4–15.2)

## 2024-05-22 LAB — DIFFERENTIAL
Abs Immature Granulocytes: 0.03 K/uL (ref 0.00–0.07)
Basophils Absolute: 0.1 K/uL (ref 0.0–0.1)
Basophils Relative: 1 %
Eosinophils Absolute: 0.5 K/uL (ref 0.0–0.5)
Eosinophils Relative: 7 %
Immature Granulocytes: 0 %
Lymphocytes Relative: 35 %
Lymphs Abs: 2.8 K/uL (ref 0.7–4.0)
Monocytes Absolute: 0.7 K/uL (ref 0.1–1.0)
Monocytes Relative: 9 %
Neutro Abs: 3.8 K/uL (ref 1.7–7.7)
Neutrophils Relative %: 48 %

## 2024-05-22 LAB — APTT: aPTT: 31 s (ref 24–36)

## 2024-05-22 NOTE — ED Provider Notes (Signed)
 [facility] Provider Note    Event Date/Time   First MD Initiated Contact with Patient 05/22/24 [phone]     (approximate)   History   Numbness   HPI  Jorge Mason is a 53 y.o. male with history of CVA, MI who presents with complaints of numbness to the right thumb, index finger and middle finger as well as the palm of his hand, he describes a tingling sensation like his hand is asleep, also has tingling to his right lower lip.  Last known well 2 AM, he noticed the symptoms when he woke up around 7:45 AM.  He reports compliance with his medications, no weakness, no head     Physical Exam   Triage Vital Signs: ED Triage Vitals  Encounter Vitals Group     BP 05/22/24 0852 (!) 162/106     Girls Systolic BP Percentile --      Girls Diastolic BP Percentile --      Boys Systolic BP Percentile --      Boys Diastolic BP Percentile --      Pulse Rate 05/22/24 0852 90     Resp 05/22/24 0852 20     Temp 05/22/24 0852 98.2 F (36.8 C)     Temp Source 05/22/24 0852 Oral     SpO2 05/22/24 0852 98 %     Weight --      Height --      Head Circumference --      Peak Flow --      Pain Score 05/22/24 0853 0     Pain Loc --      Pain Education --      Exclude from Growth Chart --     Most recent vital signs: Vitals:   05/22/24 1030 05/22/24 1226  BP: 120/74 (!) 140/79  Pulse: 71 82  Resp: 13 16  Temp:  98.1 F (36.7 C)  SpO2: 99% 100%     General: Awake, no distress.  CV:  Good peripheral perfusion.  Resp:  Normal effort.  Abd:  No distention.  Other:  Cranial nerves II through XII are normal, strength is normal in all extremities, NIHSS 1   ED Results / Procedures / Treatments   Labs (all labs ordered are listed, but only abnormal results are displayed) Labs Reviewed  CBC - Abnormal; Notable for the following components:      Result Value   Hemoglobin 12.9 (*)    HCT 38.6 (*)    All other components within normal limits  COMPREHENSIVE  METABOLIC PANEL WITH GFR - Abnormal; Notable for the following components:   Glucose, Bld 211 (*)    Total Bilirubin 1.9 (*)    All other components within normal limits  PROTIME-INR  APTT  DIFFERENTIAL  URINE DRUG SCREEN, QUALITATIVE ([facility] ONLY)     EKG  ED ECG REPORT I, Lamar Price, the attending physician, personally viewed and interpreted this ECG.  Date: 05/22/2024  Rhythm: normal sinus rhythm QRS Axis: normal Intervals: normal ST/T Wave abnormalities: normal Narrative Interpretation: no evidence of acute ischemia    RADIOLOGY CT head viewed interpret by me, no evidence of ICH, pending radiology review    PROCEDURES:  Critical Care performed:   Procedures   MEDICATIONS ORDERED IN ED: Medications - No data to display   IMPRESSION / MDM / ASSESSMENT AND PLAN / ED COURSE  I reviewed the triage vital signs and the nursing notes. Patient's presentation is most consistent with  acute presentation with potential threat to life or bodily function.  Patient presents with symptoms as above, concerning for possible CVA but outside the window for code stroke.  NIH stroke scale of 1.  Will send for CT, will obtain labs  CT scan is reassuring, lab work is unremarkable, will proceed to MRI  MRI brain is unremarkable, patient reports his symptoms have resolved.  Given this appropriate for discharge with close outpatient follow-up, return precautions discussed, he agrees to this plan.      FINAL CLINICAL IMPRESSION(S) / ED DIAGNOSES   Final diagnoses:  Paresthesia     Rx / DC Orders   ED Discharge Orders     None        Note:  This document was prepared using Dragon voice recognition software and may include unintentional dictation errors.   Arlander Charleston, MD 05/22/24 1409

## 2024-05-22 NOTE — ED Notes (Signed)
 Patient transported to CT

## 2024-05-22 NOTE — ED Notes (Signed)
 Call to lab to add orders to blood previously sent

## 2024-05-22 NOTE — ED Notes (Signed)
 Called CCMD for cardiac monitoring of pt

## 2024-05-22 NOTE — ED Triage Notes (Signed)
 Patient states he went to bed at 0200 normal. Reports he woke up at 0745 with numbness to right hand and right lower lip. Patient has steady gait at triage.

## 2024-05-22 NOTE — ED Notes (Signed)
 Full rainbow sent to lab.

## 2024-08-25 ENCOUNTER — Encounter: Payer: Self-pay | Admitting: Emergency Medicine

## 2024-08-25 ENCOUNTER — Emergency Department: Payer: Self-pay

## 2024-08-25 ENCOUNTER — Observation Stay: Payer: Self-pay

## 2024-08-25 ENCOUNTER — Observation Stay
Admission: EM | Admit: 2024-08-25 | Discharge: 2024-08-29 | DRG: 623 | Disposition: A | Payer: Self-pay | Attending: Emergency Medicine | Admitting: Emergency Medicine

## 2024-08-25 ENCOUNTER — Other Ambulatory Visit: Payer: Self-pay

## 2024-08-25 DIAGNOSIS — L97528 Non-pressure chronic ulcer of other part of left foot with other specified severity: Secondary | ICD-10-CM

## 2024-08-25 DIAGNOSIS — S91102A Unspecified open wound of left great toe without damage to nail, initial encounter: Secondary | ICD-10-CM

## 2024-08-25 DIAGNOSIS — M869 Osteomyelitis, unspecified: Secondary | ICD-10-CM

## 2024-08-25 DIAGNOSIS — I10 Essential (primary) hypertension: Secondary | ICD-10-CM | POA: Diagnosis present

## 2024-08-25 DIAGNOSIS — E663 Overweight: Secondary | ICD-10-CM | POA: Insufficient documentation

## 2024-08-25 DIAGNOSIS — E11628 Type 2 diabetes mellitus with other skin complications: Secondary | ICD-10-CM

## 2024-08-25 DIAGNOSIS — E11621 Type 2 diabetes mellitus with foot ulcer: Secondary | ICD-10-CM | POA: Diagnosis present

## 2024-08-25 DIAGNOSIS — Z8673 Personal history of transient ischemic attack (TIA), and cerebral infarction without residual deficits: Secondary | ICD-10-CM

## 2024-08-25 DIAGNOSIS — L089 Local infection of the skin and subcutaneous tissue, unspecified: Secondary | ICD-10-CM

## 2024-08-25 DIAGNOSIS — L03116 Cellulitis of left lower limb: Secondary | ICD-10-CM

## 2024-08-25 DIAGNOSIS — E1165 Type 2 diabetes mellitus with hyperglycemia: Principal | ICD-10-CM | POA: Diagnosis present

## 2024-08-25 DIAGNOSIS — I2510 Atherosclerotic heart disease of native coronary artery without angina pectoris: Secondary | ICD-10-CM | POA: Insufficient documentation

## 2024-08-25 LAB — CBC WITH DIFFERENTIAL/PLATELET
Abs Immature Granulocytes: 0.05 K/uL (ref 0.00–0.07)
Basophils Absolute: 0.1 K/uL (ref 0.0–0.1)
Basophils Relative: 1 %
Eosinophils Absolute: 0.2 K/uL (ref 0.0–0.5)
Eosinophils Relative: 2 %
HCT: 37 % — ABNORMAL LOW (ref 39.0–52.0)
Hemoglobin: 12.2 g/dL — ABNORMAL LOW (ref 13.0–17.0)
Immature Granulocytes: 1 %
Lymphocytes Relative: 17 %
Lymphs Abs: 1.8 K/uL (ref 0.7–4.0)
MCH: 29 pg (ref 26.0–34.0)
MCHC: 33 g/dL (ref 30.0–36.0)
MCV: 88.1 fL (ref 80.0–100.0)
Monocytes Absolute: 0.9 K/uL (ref 0.1–1.0)
Monocytes Relative: 9 %
Neutro Abs: 7.4 K/uL (ref 1.7–7.7)
Neutrophils Relative %: 70 %
Platelets: 319 K/uL (ref 150–400)
RBC: 4.2 MIL/uL — ABNORMAL LOW (ref 4.22–5.81)
RDW: 11.9 % (ref 11.5–15.5)
WBC: 10.5 K/uL (ref 4.0–10.5)
nRBC: 0 % (ref 0.0–0.2)

## 2024-08-25 LAB — HEPATIC FUNCTION PANEL
ALT: 12 U/L (ref 0–44)
AST: 16 U/L (ref 15–41)
Albumin: 4 g/dL (ref 3.5–5.0)
Alkaline Phosphatase: 105 U/L (ref 38–126)
Bilirubin, Direct: 0.4 mg/dL — ABNORMAL HIGH (ref 0.0–0.2)
Indirect Bilirubin: 0.8 mg/dL (ref 0.3–0.9)
Total Bilirubin: 1.2 mg/dL (ref 0.0–1.2)
Total Protein: 7.4 g/dL (ref 6.5–8.1)

## 2024-08-25 LAB — BASIC METABOLIC PANEL WITH GFR
Anion gap: 11 (ref 5–15)
BUN: 20 mg/dL (ref 6–20)
CO2: 25 mmol/L (ref 22–32)
Calcium: 9.1 mg/dL (ref 8.9–10.3)
Chloride: 97 mmol/L — ABNORMAL LOW (ref 98–111)
Creatinine, Ser: 1.15 mg/dL (ref 0.61–1.24)
GFR, Estimated: >60 mL/min (ref >60)
Glucose, Bld: 405 mg/dL — ABNORMAL HIGH (ref 70–99)
Potassium: 4 mmol/L (ref 3.5–5.1)
Sodium: 134 mmol/L — ABNORMAL LOW (ref 135–145)

## 2024-08-25 LAB — GLUCOSE, CAPILLARY
Glucose-Capillary: 341 mg/dL — ABNORMAL HIGH (ref 70–99)
Glucose-Capillary: 209 mg/dL — ABNORMAL HIGH (ref 70–99)

## 2024-08-25 LAB — LACTIC ACID, PLASMA: Lactic Acid, Venous: 1.5 mmol/L (ref 0.5–1.9)

## 2024-08-25 MED ORDER — SODIUM CHLORIDE 0.9 % IV SOLN
3.0000 g | Freq: Four times a day (QID) | INTRAVENOUS | Status: DC
  Administered 2024-08-25 – 2024-08-29 (×15): 3 g via INTRAVENOUS
  Filled 2024-08-25 (×17): qty 8

## 2024-08-25 MED ORDER — VANCOMYCIN HCL 1250 MG/250ML IV SOLN
1250.0000 mg | Freq: Once | INTRAVENOUS | Status: AC
  Administered 2024-08-25: 1250 mg via INTRAVENOUS
  Filled 2024-08-25: qty 250

## 2024-08-25 MED ORDER — GADOBUTROL 1 MMOL/ML IV SOLN
9.0000 mL | Freq: Once | INTRAVENOUS | Status: AC | PRN
  Administered 2024-08-25: 9 mL via INTRAVENOUS

## 2024-08-25 MED ORDER — ASPIRIN 81 MG PO TBEC
81.0000 mg | DELAYED_RELEASE_TABLET | Freq: Every day | ORAL | Status: DC
  Administered 2024-08-26 – 2024-08-29 (×4): 81 mg via ORAL
  Filled 2024-08-25 (×4): qty 1

## 2024-08-25 MED ORDER — SODIUM CHLORIDE 0.9 % IV SOLN: INTRAVENOUS | Status: DC

## 2024-08-25 MED ORDER — INSULIN ASPART 100 UNIT/ML IJ SOLN
0.0000 [IU] | Freq: Three times a day (TID) | INTRAMUSCULAR | Status: DC
  Administered 2024-08-25: 11 [IU] via SUBCUTANEOUS
  Administered 2024-08-26: 3 [IU] via SUBCUTANEOUS
  Administered 2024-08-26 – 2024-08-27 (×4): 5 [IU] via SUBCUTANEOUS
  Administered 2024-08-27: 3 [IU] via SUBCUTANEOUS
  Administered 2024-08-28 – 2024-08-29 (×4): 5 [IU] via SUBCUTANEOUS
  Administered 2024-08-29: 3 [IU] via SUBCUTANEOUS
  Filled 2024-08-25 (×4): qty 5
  Filled 2024-08-25: qty 3
  Filled 2024-08-25: qty 5
  Filled 2024-08-25: qty 3
  Filled 2024-08-25: qty 11
  Filled 2024-08-25: qty 3
  Filled 2024-08-25 (×3): qty 5

## 2024-08-25 MED ORDER — ALBUTEROL SULFATE (2.5 MG/3ML) 0.083% IN NEBU: 2.5000 mg | INHALATION_SOLUTION | Freq: Four times a day (QID) | RESPIRATORY_TRACT | Status: DC | PRN

## 2024-08-25 MED ORDER — ACETAMINOPHEN 650 MG RE SUPP: 650.0000 mg | Freq: Four times a day (QID) | RECTAL | Status: DC | PRN

## 2024-08-25 MED ORDER — ENOXAPARIN SODIUM 40 MG/0.4ML IJ SOSY
40.0000 mg | PREFILLED_SYRINGE | INTRAMUSCULAR | Status: DC
  Administered 2024-08-25 – 2024-08-28 (×4): 40 mg via SUBCUTANEOUS
  Filled 2024-08-25 (×4): qty 0.4

## 2024-08-25 MED ORDER — SODIUM CHLORIDE 0.9 % IV BOLUS
1000.0000 mL | Freq: Once | INTRAVENOUS | Status: AC
  Administered 2024-08-25: 1000 mL via INTRAVENOUS

## 2024-08-25 MED ORDER — VANCOMYCIN HCL IN DEXTROSE 1-5 GM/200ML-% IV SOLN
1000.0000 mg | Freq: Once | INTRAVENOUS | Status: AC
  Administered 2024-08-25: 1000 mg via INTRAVENOUS
  Filled 2024-08-25: qty 200

## 2024-08-25 MED ORDER — AMITRIPTYLINE HCL 50 MG PO TABS: 25.0000 mg | ORAL_TABLET | Freq: Every day | ORAL | Status: DC

## 2024-08-25 MED ORDER — ACETAMINOPHEN 325 MG PO TABS: 650.0000 mg | ORAL_TABLET | Freq: Four times a day (QID) | ORAL | Status: DC | PRN

## 2024-08-25 MED ORDER — VANCOMYCIN HCL 1250 MG/250ML IV SOLN
1250.0000 mg | Freq: Two times a day (BID) | INTRAVENOUS | Status: DC
  Administered 2024-08-26 (×2): 1250 mg via INTRAVENOUS
  Filled 2024-08-25 (×2): qty 250

## 2024-08-25 MED ORDER — LISINOPRIL 10 MG PO TABS
20.0000 mg | ORAL_TABLET | Freq: Every day | ORAL | Status: DC
  Administered 2024-08-25 – 2024-08-29 (×5): 20 mg via ORAL
  Filled 2024-08-25 (×5): qty 2

## 2024-08-25 MED ORDER — SODIUM CHLORIDE 0.9 % IV SOLN
2.0000 g | Freq: Once | INTRAVENOUS | Status: AC
  Administered 2024-08-25: 2 g via INTRAVENOUS
  Filled 2024-08-25: qty 12.5

## 2024-08-25 MED ORDER — HYDROCODONE-ACETAMINOPHEN 5-325 MG PO TABS
1.0000 | ORAL_TABLET | ORAL | Status: DC | PRN
  Administered 2024-08-25 – 2024-08-29 (×6): 2 via ORAL
  Filled 2024-08-25 (×6): qty 2

## 2024-08-25 MED ORDER — VANCOMYCIN HCL IN DEXTROSE 1-5 GM/200ML-% IV SOLN: 1000.0000 mg | INTRAVENOUS | Status: DC

## 2024-08-25 MED ORDER — METOPROLOL SUCCINATE ER 50 MG PO TB24: 25.0000 mg | ORAL_TABLET | Freq: Every day | ORAL | Status: DC

## 2024-08-25 MED ORDER — ATORVASTATIN CALCIUM 20 MG PO TABS
80.0000 mg | ORAL_TABLET | Freq: Every day | ORAL | Status: DC
  Administered 2024-08-25 – 2024-08-27 (×3): 80 mg via ORAL
  Filled 2024-08-25 (×3): qty 4

## 2024-08-25 MED ORDER — HYDRALAZINE HCL 20 MG/ML IJ SOLN: 5.0000 mg | Freq: Four times a day (QID) | INTRAMUSCULAR | Status: DC | PRN

## 2024-08-25 NOTE — ED Provider Notes (Signed)
 [facility] Provider Note    Event Date/Time   First MD Initiated Contact with Patient 08/25/24 1201     (approximate)   History   Wound Check   HPI  Jorge Mason is a 53 y.o. male with history of CVA, STEMI, hypertension, diabetes, and as listed in EMR presents to the emergency department for treatment and evaluation of wound to left great toe for the past 2 weeks.  Area on the left great toe started out as a dried skin.  He was peeling it and must have gotten down into some of the fresh skin.  He started soaking the area in peroxide and it had been looking better but over the past few days swelling and redness has gone from just the great toe to across the top of the foot.  No suspected fever or chills.     Physical Exam    Vitals:   08/25/24 1126  BP: (!) 148/100  Pulse: 83  Resp: 17  Temp: 98 F (36.7 C)  SpO2: 100%    General: Awake, no distress.  CV:  Good peripheral perfusion.  Resp:  Normal effort.  Abd:  No distention.  Other:  Wound to the plantar aspect of the left great toe with erythema to the dorsal aspect. See below:          ED Results / Procedures / Treatments   Labs (all labs ordered are listed, but only abnormal results are displayed)  Labs Reviewed  CBC WITH DIFFERENTIAL/PLATELET - Abnormal; Notable for the following components:      Result Value   RBC 4.20 (*)    Hemoglobin 12.2 (*)    HCT 37.0 (*)    All other components within normal limits  BASIC METABOLIC PANEL WITH GFR - Abnormal; Notable for the following components:   Sodium 134 (*)    Chloride 97 (*)    Glucose, Bld 405 (*)    All other components within normal limits  HEPATIC FUNCTION PANEL - Abnormal; Notable for the following components:   Bilirubin, Direct 0.4 (*)    All other components within normal limits  CULTURE, BLOOD (ROUTINE X 2)  CULTURE, BLOOD (ROUTINE X 2)  LACTIC ACID, PLASMA     EKG  Not  indicated.   RADIOLOGY  Image and radiology report reviewed and interpreted by me. Radiology report consistent with the same.  Image of the left foot shows no acute concerns.  PROCEDURES:  Critical Care performed: No  Procedures   MEDICATIONS ORDERED IN ED:  Medications  sodium chloride  0.9 % bolus 1,000 mL (has no administration in time range)  vancomycin (VANCOCIN) IVPB 1000 mg/200 mL premix (has no administration in time range)  ceFEPIme (MAXIPIME) 2 g in sodium chloride  0.9 % 100 mL IVPB (has no administration in time range)     IMPRESSION / MDM / ASSESSMENT AND PLAN / ED COURSE   I have reviewed the triage note and vital signs. Vital signs are stable   Differential diagnosis includes, but is not limited to, sepsis, cellulitis, diabetic ulcer, osteomyelitis.  Patient's presentation is most consistent with acute presentation with potential threat to life or bodily function.  53 year old male presenting to the emergency department for evaluation of wound to the bottom of the left great toe that has been present for the past couple of weeks.  See HPI for further details.  On exam, patient has wound to the plantar aspect of the great toe.  See  photos above.  Plan will be to get labs including lactic and blood cultures.  No leukocytosis.  Glucose is elevated at 405 with a relative sodium of 134 and chloride of 97.  Fluids and antibiotics have been ordered.  X-ray of the left foot is without acute findings.  Awaiting results of lactate and hepatic function panel.  Clinical Course as of 08/25/24 1357  Sun Aug 25, 2024  1340 Lactic is 1.5. Hepatic function is normal. Consult to hospitalist for admission entered. [CT]  1353 Patient accepted for admission. [CT]    Clinical Course User Index [CT] Kesi Perrow B, FNP     FINAL CLINICAL IMPRESSION(S) / ED DIAGNOSES   Final diagnoses:  Uncontrolled type 2 diabetes mellitus with hyperglycemia (HCC)  Cellulitis of left  foot  Open wound of left great toe, initial encounter     Rx / DC Orders   ED Discharge Orders     None        Note:  This document was prepared using Dragon voice recognition software and may include unintentional dictation errors.   Jorge Kirk NOVAK, FNP 08/25/24 1357    Dorothyann Drivers, MD 08/25/24 1435

## 2024-08-25 NOTE — ED Notes (Signed)
 See triage note  Presents with a wound to left great toe  States he noticed this about 2 weeks ago  Some redness noted to area  No drianage

## 2024-08-25 NOTE — Consult Note (Signed)
 WOC team consulted for L great toe wound. Podiatry has consulted on this wound and wrote in their notes for   Once daily dressing changes with Betadine wet-to-dry gauze, 4 x 4, Kerlix, Ace - WB status: Heel weightbearing   Pending MRI to rule out osteomyelitis.  WOC team will sign off as Podiatry is managing this wound.  Reconsult if further needs arise.   Thank you,    Powell Bar MSN, RN-BC, [employer]

## 2024-08-25 NOTE — Consult Note (Signed)
 PODIATRY CONSULTATION  NAME ICARUS PARTCH MRN [id number] DOB Nov 28, 1970 DOA 08/25/2024   Reason for consult:  Chief Complaint  Patient presents with   Wound Check    Attending/Consulting physician: Dr. Marca, MD  History of present illness: 53 y.o. male presented for the to the emergency department for a left foot wound.  He states that approximately 2 weeks ago he was trying to self trim the callus and pulled off deeper skin than he is used to.  Since that time, he has had increasing redness to the big toe.  He states that he was under the impression that his diabetes was well-controlled, on admission it was found to be 405.  HPI per Dr. Marca, MD WILBURT MESSINA is a 53 y.o. male with medical history significant of hypertension, diabetes mellitus, coronary disease status post PCI and stent.  Currently on dual antiplatelet therapy with aspirin  and Plavix .  Patient has been in his usual state of health until about 2 weeks ago when he saw a blister on his left foot.  He took the skin off but has noted worsening foot ulcer.  On account of these, patient presented to the emergency room to be further evaluated.   Past Medical History:  Diagnosis Date   Diabetes mellitus without complication (HCC)    Hypertension    Neuropathy        Latest Ref Rng & Units 08/25/2024   11:51 AM 05/22/2024    9:00 AM 12/13/2023    8:26 AM  CBC  WBC 4.0 - 10.5 K/uL 10.5  8.0  9.6   Hemoglobin 13.0 - 17.0 g/dL 87.7  87.0  87.7   Hematocrit 39.0 - 52.0 % 37.0  38.6  37.2   Platelets 150 - 400 K/uL 319  246  243        Latest Ref Rng & Units 08/25/2024   11:51 AM 05/22/2024    9:00 AM 12/13/2023    8:26 AM  BMP  Glucose 70 - 99 mg/dL 594  788  822   BUN 6 - 20 mg/dL 20  20  14    Creatinine 0.61 - 1.24 mg/dL 8.84  8.93  8.96   Sodium 135 - 145 mmol/L 134  138  140   Potassium 3.5 - 5.1 mmol/L 4.0  4.3  3.7   Chloride 98 - 111 mmol/L 97  101  105   CO2 22 - 32 mmol/L 25  27  24     Calcium  8.9 - 10.3 mg/dL 9.1  9.0  8.8       Physical Exam: Lower Extremity Exam Vasc:  L - PT faintly palpable, DP faintly palpable. Cap refill <3 sec to digits  Derm:   L -ulceration measuring 1.5 x 1.5 x 0.4 cm noted at the medial aspect of the left hallux with surrounding erythema extending to the forefoot.  No drainage or purulence.  Minor malodor.  Negative probe to bone.  Hyperkeratotic rim with undermining extending approximately 0.5 cm circumferentially.  MSK:    L -  No gross deformities. Compartments soft, non-tender, compressible  Neuro:    L - Gross sensation intact. Gross motor function intact       XR left foot 08/25/24: FINDINGS: Exam demonstrates no evidence of acute fracture or dislocation. Minimal degenerative changes over the interphalangeal joints. No evidence of bone destruction to suggest osteomyelitis. No air within the soft tissues. Mild soft tissue swelling over the first toe.   IMPRESSION: 1. No acute  findings. 2. Mild soft tissue swelling over the first toe.  ASSESSMENT/PLAN OF CARE 53 y.o. male presents to the emergency department for worsening left hallux wound.  This has been present for approximately 2 weeks and has recently increased in erythema, edema.  He denies seeing any purulent drainage.  - Discussed with the patient that there are no changes consistent with osteomyelitis on x-ray imaging, MRI ordered to further evaluate this area.  We discussed that if MRI does show signs of bone infection, would recommend hallux amputation.  If there are there are no findings of osteomyelitis, recommend local wound care and antibiotics.  He does express understanding of this.  - Debridement as described low was performed today.  Small purulent pocket was encountered.  Culture of purulence taken. - ABI ordered to evaluate blood flow and healing potential - Continue IV abx broad spectrum pending further culture data - Anticoagulation: Per primary -  Wound care: Once daily dressing changes with Betadine wet-to-dry gauze, 4 x 4, Kerlix, Ace - WB status: Heel weightbearing - Will continue to follow  Procedure: Debridement left hallux ulceration -Discussed with the patient all risks, alternatives, potential treatments.  He elects to proceed with debridement of left hallux ulceration. -Attention was directed to the medial aspect of the left hallux where an ulceration with surrounding hyperkeratotic tissue was noted be present.  A 15 blade and scissors were utilized to remove hyperkeratotic tissue and all nonviable tissue.  There was a small purulent pocket that was encountered, incised with the resulting purulent material cultured.  Purulence was present at the proximal aspect of the wound.  There is no purulence distally.  All hyperkeratotic tissue, remaining nonviable tissue was excised.  Hemostasis obtained utilizing compression.  Postdebridement wound measurements approximately 2 cm x 2.5 cm x 0.3 cm.  Remaining tissue appears viable.  Betadine wet-to-dry gauze followed by dry sterile dressing applied.    Thank you for the consult.  Please contact me directly with any questions or concerns.           Prentice Ovens, DPM [facility]    [address] [place], [place] [address]                Office [phone]  Fax [phone]

## 2024-08-25 NOTE — ED Triage Notes (Signed)
 Pt reports wound on L great toe x2 weeks. Pt has diabetes. Pt says redness around wound is spreading and it is painful. Pt endorses chills that started yesterday.

## 2024-08-25 NOTE — Plan of Care (Signed)

## 2024-08-25 NOTE — Consult Note (Signed)
 Pharmacy Antibiotic Note  Jorge Mason is a 53 y.o. male admitted on 08/25/2024 with  Left diabetic foot ulcer with surrounding cellulitis. Podiatry consulted for I&D. MRI ordered to r/o osteo. Pharmacy has been consulted for vancomycin and unasyn dosing.  Plan:  Unasyn 3G q6h Vancomycin 2250 mg LD given at 1826 on 1130 Vancomycin 1250 mg IV Q 12 hrs ordered to start at 0600 on 1201 . Goal AUC 400-550. Obtain levels at steady state or as clinically indicated.   Expected AUC: 494.8 SCr used: 1.15  Weight used for dosing: IBW Cmin: 14.3 Will monitor serum creatinine daily while on vanc. Follow renal function and cultures for adjustments   Height: 6' (182.9 cm) Weight: 95.3 kg (210 lb) IBW/kg (Calculated) : 77.6  Temp (24hrs), Avg:98 F (36.7 C), Min:98 F (36.7 C), Max:98.1 F (36.7 C)  Recent Labs  Lab 08/25/24 1151 08/25/24 1250  WBC 10.5  --   CREATININE 1.15  --   LATICACIDVEN  --  1.5    Estimated Creatinine Clearance: 91 mL/min (by C-G formula based on SCr of 1.15 mg/dL).    No Known Allergies  Antimicrobials this admission: 1130 vancomycin >>  1130 unasyn >>    Microbiology results: 1130 BCx: pending  Thank you for allowing pharmacy to be a part of this patient's care.  Annabella LOISE Banks, PharmD Clinical Pharmacist 08/25/2024 5:15 PM

## 2024-08-25 NOTE — ED Notes (Signed)
 Advised nurse that patient has ready bed

## 2024-08-25 NOTE — H&P (Signed)
 History and Physical    Patient: Jorge Mason FMW:[id number] DOB: Apr 10, 1971 DOA: 08/25/2024 DOS: the patient was seen and examined on 08/25/2024 PCP: Pcp, No  Patient coming from: Home  Chief Complaint:  Chief Complaint  Patient presents with   Wound Check   HPI: Jorge Mason is a 53 y.o. male with medical history significant of hypertension, diabetes mellitus, coronary disease status post PCI and stent.  Currently on dual antiplatelet therapy with aspirin  and Plavix .  Patient has been in his usual state of health until about 2 weeks ago when he saw a blister on his left foot.  He took the skin off but has noted worsening foot ulcer.  On account of these, patient presented to the emergency room to be further evaluated.  In the ED, patient was noted to be hyperglycemic with blood glucose of 105.  He is on metformin at home.  Admits compliance.  Blood glucose however does suggest poor glucose control.  Patient was referred to hospitalist service for optimization of his diabetes mellitus. Patient denied any fever.  He also complains of Review of Systems: As mentioned in the history of present illness. All other systems reviewed and are negative. Past Medical History:  Diagnosis Date   Diabetes mellitus without complication (HCC)    Hypertension    Neuropathy    Past Surgical History:  Procedure Laterality Date   CORONARY/GRAFT ACUTE MI REVASCULARIZATION N/A 08/19/2023   Procedure: Coronary/Graft Acute MI Revascularization;  Surgeon: Ammon Blunt, MD;  Location: [facility] INVASIVE CV LAB;  Service: Cardiovascular;  Laterality: N/A;   dental procedure     LEFT HEART CATH AND CORONARY ANGIOGRAPHY N/A 08/19/2023   Procedure: LEFT HEART CATH AND CORONARY ANGIOGRAPHY;  Surgeon: Ammon Blunt, MD;  Location: [facility] INVASIVE CV LAB;  Service: Cardiovascular;  Laterality: N/A;   Social History:  reports that he has never smoked. He has never used smokeless tobacco. He reports  that he does not drink alcohol and does not use drugs.  No Known Allergies  Family History  Problem Relation Age of Onset   Stroke Mother    Stroke Father    Heart attack Father     Prior to Admission medications   Medication Sig Start Date End Date Taking? Authorizing Provider  amitriptyline  (ELAVIL ) 25 MG tablet Take 25 mg by mouth at bedtime. 04/23/21   [provider]  atorvastatin  (LIPITOR ) 80 MG tablet Take 1 tablet (80 mg total) by mouth daily. 08/20/23 08/19/24  Von Bellis, MD  lisinopril  (ZESTRIL ) 20 MG tablet Take 20 mg by mouth daily. 03/26/21   [provider]  metFORMIN (GLUCOPHAGE-XR) 500 MG 24 hr tablet Take 1,000 mg by mouth 2 (two) times daily. 03/26/21   [provider]  metoprolol  succinate (TOPROL -XL) 25 MG 24 hr tablet Take 1 tablet (25 mg total) by mouth daily. Take with or immediately following a meal. 08/20/23 08/19/24  Von Bellis, MD    Physical Exam: Vitals:   08/25/24 1126  BP: (!) 148/100  Pulse: 83  Resp: 17  Temp: 98 F (36.7 C)  TempSrc: Oral  SpO2: 100%  Weight: 95.3 kg  Height: 6' (1.829 m)   General: Patient is well-looking gentleman who is alert oriented x 3.  Not in any acute distress HEENT: Head is atraumatic, pupils equal round reactive to light accommodation, conjunctiva is anicteric Neck is supple with no JVD Chest clinically clear without any added sounds Cardiovascular regular S1-S2 with no murmur Abdomen soft nontender with no  organomegaly Extremities without pedal edema.  Left foot however has an ulcer on the left great toe with yellow eschar.  It is foul-smelling.  No active drainage however noted.  Data Reviewed: Labs significant for sodium of 134, potassium 4.0, chloride 97, bicarb 25, glucose 405, BUN 20, creatinine 1.15, AST 16, ALT 12,  WBCs 10.5, hemoglobin 12.2, hematocrit 37, platelet count 318 neutrophil count of 70  X-ray of the foot: No acute findings.  Mild soft tissue swelling of the  left first toe was reported.  No bone destruction concerning for osteomyelitis.  Assessment and Plan:  53 year old gentleman with medical history significant for diabetes mellitus, hypertension, coronary artery disease with STEMI 07/2023 status post PCI to LAD.  Patient presents with new left diabetic foot ulcer.  #1.  Left diabetic foot ulcer with surrounding cellulitis: Patient will optimize with insulin  therapy.  Patient be treated with IV antibiotics.  Podiatrist has been consulted to help with debridement of wound site.  Wound care consult.  No bone involvement noted on x-rays.  Consider ABI for this patient.  #2.  Poorly controlled type II diabetes mellitus: Patient is on metformin at baseline.  Last A1c was 11.6.  Repeat A1c pending patient would benefit from long-acting insulin  therapy.Fingerstick glucose monitoring.  Insulin  sliding scale.  #3. History of coronary disease status post PCI to LAD.  Patient is on statins and dual antiplatelet therapy with Plavix  and aspirin .  Continue with same.  Patient is asymptomatic and his admission  #4: Hypertension: Patient reports being on lisinopril  only for now.  Hydralazine  as needed for SBP greater than 180 mmHg.  #5.  History of CVA in 07/2021   Advance Care Planning:   Code Status: Full Code   Consults: Podiatry  Family Communication: No family at bedside  Severity of Illness: The appropriate patient status for this patient is INPATIENT. Inpatient status is judged to be reasonable and necessary in order to provide the required intensity of service to ensure the patient's safety. The patient's presenting symptoms, physical exam findings, and initial radiographic and laboratory data in the context of their chronic comorbidities is felt to place them at high risk for further clinical deterioration. Furthermore, it is not anticipated that the patient will be medically stable for discharge from the hospital within 2 midnights of admission.   *  I certify that at the point of admission it is my clinical judgment that the patient will require inpatient hospital care spanning beyond 2 midnights from the point of admission due to high intensity of service, high risk for further deterioration and high frequency of surveillance required.*  Author: Maude MARLA Dart, MD 08/25/2024 2:31 PM  For on call review [email or web address].

## 2024-08-25 NOTE — ED Notes (Signed)
Consulting MD in with pt   

## 2024-08-26 DIAGNOSIS — Z794 Long term (current) use of insulin: Secondary | ICD-10-CM

## 2024-08-26 DIAGNOSIS — E663 Overweight: Secondary | ICD-10-CM | POA: Insufficient documentation

## 2024-08-26 DIAGNOSIS — M869 Osteomyelitis, unspecified: Secondary | ICD-10-CM

## 2024-08-26 DIAGNOSIS — I2510 Atherosclerotic heart disease of native coronary artery without angina pectoris: Secondary | ICD-10-CM | POA: Insufficient documentation

## 2024-08-26 DIAGNOSIS — L02612 Cutaneous abscess of left foot: Secondary | ICD-10-CM

## 2024-08-26 DIAGNOSIS — E1165 Type 2 diabetes mellitus with hyperglycemia: Principal | ICD-10-CM

## 2024-08-26 DIAGNOSIS — L089 Local infection of the skin and subcutaneous tissue, unspecified: Secondary | ICD-10-CM

## 2024-08-26 DIAGNOSIS — Z7984 Long term (current) use of oral hypoglycemic drugs: Secondary | ICD-10-CM

## 2024-08-26 DIAGNOSIS — B9689 Other specified bacterial agents as the cause of diseases classified elsewhere: Secondary | ICD-10-CM

## 2024-08-26 DIAGNOSIS — L03032 Cellulitis of left toe: Secondary | ICD-10-CM

## 2024-08-26 LAB — CBC
HCT: 36.6 % — ABNORMAL LOW (ref 39.0–52.0)
Hemoglobin: 11.9 g/dL — ABNORMAL LOW (ref 13.0–17.0)
MCH: 29.1 pg (ref 26.0–34.0)
MCHC: 32.5 g/dL (ref 30.0–36.0)
MCV: 89.5 fL (ref 80.0–100.0)
Platelets: 315 K/uL (ref 150–400)
RBC: 4.09 MIL/uL — ABNORMAL LOW (ref 4.22–5.81)
RDW: 12 % (ref 11.5–15.5)
WBC: 9 K/uL (ref 4.0–10.5)
nRBC: 0 % (ref 0.0–0.2)

## 2024-08-26 LAB — BASIC METABOLIC PANEL WITH GFR
Anion gap: 9 (ref 5–15)
BUN: 16 mg/dL (ref 6–20)
CO2: 26 mmol/L (ref 22–32)
Calcium: 8.6 mg/dL — ABNORMAL LOW (ref 8.9–10.3)
Chloride: 101 mmol/L (ref 98–111)
Creatinine, Ser: 1.05 mg/dL (ref 0.61–1.24)
GFR, Estimated: >60 mL/min (ref >60)
Glucose, Bld: 217 mg/dL — ABNORMAL HIGH (ref 70–99)
Potassium: 4 mmol/L (ref 3.5–5.1)
Sodium: 135 mmol/L (ref 135–145)

## 2024-08-26 LAB — GLUCOSE, CAPILLARY
Glucose-Capillary: 191 mg/dL — ABNORMAL HIGH (ref 70–99)
Glucose-Capillary: 245 mg/dL — ABNORMAL HIGH (ref 70–99)
Glucose-Capillary: 224 mg/dL — ABNORMAL HIGH (ref 70–99)
Glucose-Capillary: 216 mg/dL — ABNORMAL HIGH (ref 70–99)

## 2024-08-26 LAB — HEMOGLOBIN A1C
Hgb A1c MFr Bld: 13 % — ABNORMAL HIGH (ref 4.8–5.6)
Hgb A1c MFr Bld: 13 % — ABNORMAL HIGH (ref 4.8–5.6)
Mean Plasma Glucose: 326 mg/dL
Mean Plasma Glucose: 326 mg/dL

## 2024-08-26 LAB — HIV ANTIBODY (ROUTINE TESTING W REFLEX): HIV Screen 4th Generation wRfx: NONREACTIVE

## 2024-08-26 MED ORDER — LINEZOLID 600 MG PO TABS
600.0000 mg | ORAL_TABLET | Freq: Two times a day (BID) | ORAL | Status: DC
  Administered 2024-08-26 – 2024-08-28 (×4): 600 mg via ORAL
  Filled 2024-08-26 (×6): qty 1

## 2024-08-26 MED ORDER — LIDOCAINE HCL (PF) 1 % IJ SOLN
10.0000 mL | Freq: Once | INTRAMUSCULAR | Status: AC
  Administered 2024-08-26: 5 mL via INTRADERMAL
  Filled 2024-08-26 (×2): qty 10

## 2024-08-26 MED ORDER — INSULIN GLARGINE-YFGN 100 UNIT/ML ~~LOC~~ SOLN
12.0000 [IU] | Freq: Every day | SUBCUTANEOUS | Status: DC
  Administered 2024-08-26 – 2024-08-29 (×4): 12 [IU] via SUBCUTANEOUS
  Filled 2024-08-26 (×4): qty 0.12

## 2024-08-26 NOTE — Consult Note (Addendum)
 NAME: Jorge Mason  DOB: 1971-05-03  MRN: [id number]  Date/Time: 08/26/2024 12:34 PM  REQUESTING PROVIDER: Dr. Laurita Subjective:  REASON FOR CONSULT: left great toe infection ? Jorge Mason is a 53 y.o. with a history of DM, HTN, CAD S/p Stent, CVA presented with 2 week h/o left great toe wound which was worsenign over the past few days with increasing swelling, redness and pain Pt states he noted dry skin on the great toe and he peeled it, at one end it started to bleed. He cleaned it with peroxide and covered it. He was continuing to work. Last week he noted the ote was red and increasing swelling with some chills and came to the ED on 08/25/24   08/25/24 11:26  BP 148/100 (H)  Temp 98 F (36.7 C)  Pulse Rate 83  Resp 17  SpO2 100 %    Latest Reference Range & Units 08/25/24 11:51  WBC 4.0 - 10.5 K/uL 10.5  Hemoglobin 13.0 - 17.0 g/dL 87.7 (L)  HCT 60.9 - 47.9 % 37.0 (L)  Platelets 150 - 400 K/uL 319  Creatinine 0.61 - 1.24 mg/dL 8.84   Blood culture sent MRI foot showed Soft tissue wound medial to the interphalangeal joint of the great toe with underlying ill-defined fluid collection suspicious for a small abscess. 2. Abnormal marrow signal and enhancement within the adjacent distal phalanx of the great toe and distal half of the proximal phalanx of the great toe, suspicious for early osteomyelitis. HE was started on vanco and unasyn Seen by podiatrist who recommended amputation but he wanted to take antibiotics first . They did I/d today and sent culture I am asked to see him for antibiotic management   Past Medical History:  Diagnosis Date   Diabetes mellitus without complication (HCC)    Hypertension    Neuropathy    CAD s/p stents LAD NOV 2024 Past Surgical History:  Procedure Laterality Date   CORONARY/GRAFT ACUTE MI REVASCULARIZATION N/A 08/19/2023   Procedure: Coronary/Graft Acute MI Revascularization;  Surgeon: Ammon Blunt, MD;  Location:  [facility] INVASIVE CV LAB;  Service: Cardiovascular;  Laterality: N/A;   dental procedure     LEFT HEART CATH AND CORONARY ANGIOGRAPHY N/A 08/19/2023   Procedure: LEFT HEART CATH AND CORONARY ANGIOGRAPHY;  Surgeon: Ammon Blunt, MD;  Location: [facility] INVASIVE CV LAB;  Service: Cardiovascular;  Laterality: N/A;    Social History   Socioeconomic History   Marital status: Married    Spouse name: Not on file   Number of children: Not on file   Years of education: Not on file   Highest education level: Not on file  Occupational History   Not on file  Tobacco Use   Smoking status: Never   Smokeless tobacco: Never  Substance and Sexual Activity   Alcohol use: Never   Drug use: Never   Sexual activity: Not Currently  Other Topics Concern   Not on file  Social History Narrative   Not on file   Social Drivers of Health   Financial Resource Strain: Not on file  Food Insecurity: No Food Insecurity (08/25/2024)   Hunger Vital Sign    Worried About Running Out of Food in the Last Year: Never true    Ran Out of Food in the Last Year: Never true  Transportation Needs: No Transportation Needs (08/25/2024)   PRAPARE - Administrator, Civil Service (Medical): No    Lack of Transportation (Non-Medical): No  Physical Activity: Not  on file  Stress: Not on file  Social Connections: Unknown (08/25/2024)   Social Connection and Isolation Panel    Frequency of Communication with Friends and Family: Twice a week    Frequency of Social Gatherings with Friends and Family: Once a week    Attends Religious Services: Patient declined    Database Administrator or Organizations: Patient declined    Attends Banker Meetings: Patient declined    Marital Status: Living with partner  Intimate Partner Violence: Not At Risk (08/25/2024)   Humiliation, Afraid, Rape, and Kick questionnaire    Fear of Current or Ex-Partner: No    Emotionally Abused: No    Physically Abused: No     Sexually Abused: No    Family History  Problem Relation Age of Onset   Stroke Mother    Stroke Father    Heart attack Father    No Known Allergies I? Current Facility-Administered Medications  Medication Dose Route Frequency Provider Last Rate Last Admin   acetaminophen  (TYLENOL ) tablet 650 mg  650 mg Oral Q6H PRN Acheampong, Peter K, MD       Or   acetaminophen  (TYLENOL ) suppository 650 mg  650 mg Rectal Q6H PRN Acheampong, Peter K, MD       albuterol (PROVENTIL) (2.5 MG/3ML) 0.083% nebulizer solution 2.5 mg  2.5 mg Nebulization Q6H PRN Acheampong, Peter K, MD       Ampicillin-Sulbactam (UNASYN) 3 g in sodium chloride  0.9 % 100 mL IVPB  3 g Intravenous Q6H Acheampong, Peter K, MD 200 mL/hr at 08/26/24 0603 3 g at 08/26/24 0603   aspirin  EC tablet 81 mg  81 mg Oral Daily Acheampong, Peter K, MD   81 mg at 08/26/24 0920   atorvastatin  (LIPITOR ) tablet 80 mg  80 mg Oral QHS Acheampong, Peter K, MD   80 mg at 08/25/24 2230   enoxaparin  (LOVENOX ) injection 40 mg  40 mg Subcutaneous Q24H Acheampong, Peter K, MD   40 mg at 08/25/24 1645   hydrALAZINE  (APRESOLINE ) injection 5 mg  5 mg Intravenous Q6H PRN Acheampong, Peter K, MD       HYDROcodone -acetaminophen  (NORCO/VICODIN) 5-325 MG per tablet 1-2 tablet  1-2 tablet Oral Q4H PRN Acheampong, Peter K, MD   2 tablet at 08/25/24 2230   insulin  aspart (novoLOG ) injection 0-15 Units  0-15 Units Subcutaneous TID WC Acheampong, Peter K, MD   3 Units at 08/26/24 0920   insulin  glargine-yfgn (SEMGLEE) injection 12 Units  12 Units Subcutaneous Daily Laurita Pillion, MD       lidocaine  (PF) (XYLOCAINE ) 1 % injection 10 mL  10 mL Intradermal Once Regal, Andrew J, DPM       lisinopril  (ZESTRIL ) tablet 20 mg  20 mg Oral Daily Acheampong, Peter K, MD   20 mg at 08/26/24 0920   vancomycin (VANCOREADY) IVPB 1250 mg/250 mL  1,250 mg Intravenous Q12H Acheampong, Peter K, MD 166.7 mL/hr at 08/26/24 0608 1,250 mg at 08/26/24 0608     Abtx:  Anti-infectives (From  admission, onward)    Start     Dose/Rate Route Frequency Ordered Stop   08/26/24 0600  vancomycin (VANCOREADY) IVPB 1250 mg/250 mL        1,250 mg 166.7 mL/hr over 90 Minutes Intravenous Every 12 hours 08/25/24 1710     08/25/24 1800  Ampicillin-Sulbactam (UNASYN) 3 g in sodium chloride  0.9 % 100 mL IVPB        3 g 200 mL/hr over 30 Minutes Intravenous  Every 6 hours 08/25/24 1656     08/25/24 1745  vancomycin  (VANCOREADY) IVPB 1250 mg/250 mL        1,250 mg 166.7 mL/hr over 90 Minutes Intravenous  Once 08/25/24 1658 08/25/24 1956   08/25/24 1730  vancomycin  (VANCOCIN ) IVPB 1000 mg/200 mL premix  Status:  Discontinued        1,000 mg 200 mL/hr over 60 Minutes Intravenous Every 24 hours 08/25/24 1644 08/25/24 1658   08/25/24 1330  vancomycin  (VANCOCIN ) IVPB 1000 mg/200 mL premix        1,000 mg 200 mL/hr over 60 Minutes Intravenous  Once 08/25/24 1323 08/25/24 1756   08/25/24 1330  ceFEPIme  (MAXIPIME ) 2 g in sodium chloride  0.9 % 100 mL IVPB        2 g 200 mL/hr over 30 Minutes Intravenous  Once 08/25/24 1323 08/25/24 1504       REVIEW OF SYSTEMS:  Const: subjective  fever,  chills, negative weight loss Eyes: negative diplopia or visual changes, negative eye pain ENT: negative coryza, negative sore throat Resp: negative cough, hemoptysis, dyspnea Cards: negative for chest pain, palpitations, lower extremity edema GU: negative for frequency, dysuria and hematuria GI: Negative for abdominal pain, diarrhea, bleeding, constipation Skin: negative for rash and pruritus Heme: negative for easy bruising and gum/nose bleeding MS: as above Neurolo:negative for headaches, dizziness, vertigo, memory problems  Psych: negative for feelings of anxiety, depression  Endocrine: negative for thyroid, diabetes Allergy/Immunology- negative for any medication or food allergies ?  Objective:  VITALS:  BP 117/77 (BP Location: Right Arm)   Pulse 66   Temp 98.1 F (36.7 C)   Resp 20   Ht 6'  (1.829 m)   Wt 95.3 kg   SpO2 99%   BMI 28.48 kg/m   PHYSICAL EXAM:  General: Alert, cooperative, no distress, appears stated age.  Head: Normocephalic, without obvious abnormality, atraumatic. Eyes: Conjunctivae clear, anicteric sclerae. Pupils are equal ENT Nares normal. No drainage or sinus tenderness. Lips, mucosa, and tongue normal. No Thrush Neck: Supple, symmetrical, no adenopathy, thyroid: non tender no carotid bruit and no JVD. Back: No CVA tenderness. Lungs: Clear to auscultation bilaterally. No Wheezing or Rhonchi. No rales. Heart: Regular rate and rhythm, no murmur, rub or gallop. Abdomen: Soft, non-tender,not distended. Bowel sounds normal. No masses Extremities:  Left great toe swollen , erythematous Wound on the plantar aspect     Skin: No rashes or lesions. Or bruising Lymph: Cervical, supraclavicular normal. Neurologic: Grossly non-focal Pertinent Labs Lab Results CBC    Component Value Date/Time   WBC 9.0 08/26/2024 0623   RBC 4.09 (L) 08/26/2024 0623   HGB 11.9 (L) 08/26/2024 0623   HCT 36.6 (L) 08/26/2024 0623   PLT 315 08/26/2024 0623   MCV 89.5 08/26/2024 0623   MCH 29.1 08/26/2024 0623   MCHC 32.5 08/26/2024 0623   RDW 12.0 08/26/2024 0623   LYMPHSABS 1.8 08/25/2024 1151   MONOABS 0.9 08/25/2024 1151   EOSABS 0.2 08/25/2024 1151   BASOSABS 0.1 08/25/2024 1151       Latest Ref Rng & Units 08/26/2024    6:23 AM 08/25/2024   12:50 PM 08/25/2024   11:51 AM  CMP  Glucose 70 - 99 mg/dL 782   594   BUN 6 - 20 mg/dL 16   20   Creatinine 9.38 - 1.24 mg/dL 8.94   8.84   Sodium 864 - 145 mmol/L 135   134   Potassium 3.5 - 5.1 mmol/L 4.0  4.0   Chloride 98 - 111 mmol/L 101   97   CO2 22 - 32 mmol/L 26   25   Calcium  8.9 - 10.3 mg/dL 8.6   9.1   Total Protein 6.5 - 8.1 g/dL  7.4    Total Bilirubin 0.0 - 1.2 mg/dL  1.2    Alkaline Phos 38 - 126 U/L  105    AST 15 - 41 U/L  16    ALT 0 - 44 U/L  12        Microbiology: Recent Results  (from the past 240 hours)  Blood culture (routine x 2)     Status: None (Preliminary result)   Collection Time: 08/25/24 12:54 PM   Specimen: BLOOD  Result Value Ref Range Status   Specimen Description BLOOD BLOOD LEFT HAND  Final   Special Requests   Final    BOTTLES DRAWN AEROBIC AND ANAEROBIC Blood Culture adequate volume   Culture   Final    NO GROWTH < 24 HOURS Performed at [facility], [address], [place], [place] [address]    Report Status PENDING  Incomplete  Blood culture (routine x 2)     Status: None (Preliminary result)   Collection Time: 08/25/24 12:54 PM   Specimen: BLOOD  Result Value Ref Range Status   Specimen Description BLOOD BLOOD RIGHT HAND  Final   Special Requests   Final    BOTTLES DRAWN AEROBIC AND ANAEROBIC Blood Culture adequate volume   Culture   Final    NO GROWTH < 24 HOURS Performed at [facility], [address] [place], [place] [address]    Report Status PENDING  Incomplete     IMAGING RESULTS: Xray foot No evidence of bone destruction  MRI foot left Abscess great toe Possible osteo of the distal phalanx  I have personally reviewed the films ? Impression/Recommendation ? Left great toe  infection with poorly controlled Diabetes mellitus  Abscess great toe with cellulitis and open wound- MRI questioning early osteo Likely Staph /or strep Pt currently on vanco and unasyn - change vanco to linezolid  Await culture result If patient is discharged before culture is finalized then he will have to go on augmentin  and Doxycycline HE has no insurance , and may not be able to pay out of pocket for linezolid  Minimum duration is 2 weeks and depending on his progress will extend antibiotics  DM poorly controlled Pt was taking Metformin as OP On insulin  here  CAD s/p LAD stent- was on atorvastatin , clopidogrel , aspirin   HTN on lisinopril   Discussed the management with the patient in detail  Discussed with care  team  This consult involved complex antimicrobial management ? _  ________________________________________________  Note:  This document was prepared using Dragon voice recognition software and may include unintentional dictation errors.

## 2024-08-26 NOTE — Progress Notes (Signed)
  Progress Note   Patient: Jorge Mason FMW:[id number] DOB: Oct 04, 1970 DOA: 08/25/2024     0 DOS: the patient was seen and examined on 08/26/2024   Brief hospital course: Jorge Mason is a 53 y.o. male with medical history significant of hypertension, diabetes mellitus, coronary disease status post PCI and stent.  Currently on dual antiplatelet therapy with aspirin  and Plavix .  Patient has been in his usual state of health until about 2 weeks ago when he saw a blister on his left foot.  He took the skin off but has noted worsening foot ulcer.  MRI of the foot showed early osteomyelitis with a small abscess. Patient has been seen by podiatry, consider great toe amputation.   Principal Problem:   Diabetic foot ulcer (HCC) Active Problems:   Uncontrolled type 2 diabetes mellitus with hyperglycemia, without long-term current use of insulin  (HCC)   Hypertension   History of CVA 07/2021(cerebrovascular accident)   Overweight (BMI 25.0-29.9)   Osteomyelitis of great toe of left foot (HCC)   Assessment and Plan: Diabetic foot infection with osteomyelitis of the left great toe. MRI showed small abscess with osteomyelitis of the left great toe.  Has been evaluated by podiatry, may need great toe amputation. Continue current antibiotics with vancomycin and Unasyn. So far blood culture negative.  Uncontrolled type 2 diabetes with hyperglycemia. Patient most recent A1c was in the last year, 11.4.  Glucose running high now.  Recheck A1c.  Start long-acting insulin  as well as sliding scale insulin .  History of stroke. Continue to follow.  Essential hypertension. On lisinopril .       Subjective:  Patient doing well, no significant pain.  No shortness of breath  Physical Exam: Vitals:   08/25/24 1510 08/25/24 2046 08/26/24 0456 08/26/24 0754  BP: 139/84 (!) 131/93 110/71 117/77  Pulse: 67 77 72 66  Resp: 16 17 17 20   Temp: 98 F (36.7 C) 97.9 F (36.6 C) 98.1 F (36.7 C)  98.1 F (36.7 C)  TempSrc: Oral     SpO2: 100% 99% 99% 99%  Weight:      Height:       General exam: Appears calm and comfortable  Respiratory system: Clear to auscultation. Respiratory effort normal. Cardiovascular system: S1 & S2 heard, RRR. No JVD, murmurs, rubs, gallops or clicks. No pedal edema. Gastrointestinal system: Abdomen is nondistended, soft and nontender. No organomegaly or masses felt. Normal bowel sounds heard. Central nervous system: Alert and oriented. No focal neurological deficits. Extremities: Left great toe ulcer Skin: No rashes, lesions or ulcers Psychiatry: Judgement and insight appear normal. Mood & affect appropriate.    Data Reviewed:  Reviewed MRI results and lab results.  Family Communication: None  Disposition: Status is: Observation The patient will require care spanning > 2 midnights and should be moved to inpatient because: Severity of disease, IV treatment, inpatient procedure.     Time spent: 35 minutes  Author: Murvin Mana, MD 08/26/2024 10:16 AM  For on call review [email or web address].

## 2024-08-26 NOTE — Hospital Course (Signed)
 Jorge Mason is a 53 y.o. male with medical history significant of hypertension, diabetes mellitus, coronary disease status post PCI and stent.  Currently on dual antiplatelet therapy with aspirin  and Plavix .  Patient has been in his usual state of health until about 2 weeks ago when he saw a blister on his left foot.  He took the skin off but has noted worsening foot ulcer.  MRI of the foot showed early osteomyelitis with a small abscess. Patient has been seen by podiatry, had a debridement.  Decision was made not to perform amputation, seen by ID, currently treated with Zyvox and Unasyn. Final culture results still pending from the wound.  12/3.  Moderate Proteus mirabilis and moderate Enterococcus faecalis growing out of wound culture sensitivities pending.  Diabetic teaching on how to inject insulin  done by diabetes coordinator.  12/4.  Case discussed with infectious disease specialist and recommended 2 weeks of Levaquin and Augmentin.  Patient is stable for discharge home.

## 2024-08-26 NOTE — Progress Notes (Signed)
 Subjective:  Patient ID: Jorge Mason, male    DOB: 03/12/1971,  MRN: [id number]  Chief Complaint  Patient presents with   Wound Check    53 y.o. male in hospital for left hallux wound.  He had an MRI completed yesterday that demonstrated early osteomyelitis as well as small abscess formation at the medial hallux.  I debrided the wound yesterday and took a culture, does not seem to have been sent.   Interval history 08/25/24:  53 y.o. male presented for the to the emergency department for a left foot wound.  He states that approximately 2 weeks ago he was trying to self trim the callus and pulled off deeper skin than he is used to.  Since that time, he has had increasing redness to the big toe.  He states that he was under the impression that his diabetes was well-controlled, on admission it was found to be 405.   Review of Systems: Negative except as noted in the HPI. Denies N/V/F/Ch.  Past Medical History:  Diagnosis Date   Diabetes mellitus without complication (HCC)    Hypertension    Neuropathy     Current Facility-Administered Medications:    acetaminophen  (TYLENOL ) tablet 650 mg, 650 mg, Oral, Q6H PRN **OR** acetaminophen  (TYLENOL ) suppository 650 mg, 650 mg, Rectal, Q6H PRN, Acheampong, Peter K, MD   albuterol (PROVENTIL) (2.5 MG/3ML) 0.083% nebulizer solution 2.5 mg, 2.5 mg, Nebulization, Q6H PRN, Acheampong, Peter K, MD   Ampicillin-Sulbactam (UNASYN) 3 g in sodium chloride  0.9 % 100 mL IVPB, 3 g, Intravenous, Q6H, Acheampong, Maude POUR, MD, Last Rate: 200 mL/hr at 08/26/24 0603, 3 g at 08/26/24 0603   aspirin  EC tablet 81 mg, 81 mg, Oral, Daily, Acheampong, Peter K, MD, 81 mg at 08/26/24 0920   atorvastatin  (LIPITOR ) tablet 80 mg, 80 mg, Oral, QHS, Acheampong, Peter K, MD, 80 mg at 08/25/24 2230   enoxaparin  (LOVENOX ) injection 40 mg, 40 mg, Subcutaneous, Q24H, Acheampong, Peter K, MD, 40 mg at 08/25/24 1645   hydrALAZINE  (APRESOLINE ) injection 5 mg, 5 mg, Intravenous, Q6H  PRN, Acheampong, Peter K, MD   HYDROcodone -acetaminophen  (NORCO/VICODIN) 5-325 MG per tablet 1-2 tablet, 1-2 tablet, Oral, Q4H PRN, Acheampong, Maude POUR, MD, 2 tablet at 08/25/24 2230   insulin  aspart (novoLOG ) injection 0-15 Units, 0-15 Units, Subcutaneous, TID WC, Acheampong, Peter K, MD, 3 Units at 08/26/24 0920   insulin  glargine-yfgn (SEMGLEE) injection 12 Units, 12 Units, Subcutaneous, Daily, Zhang, Dekui, MD   lidocaine  (PF) (XYLOCAINE ) 1 % injection 10 mL, 10 mL, Intradermal, Once, Jamesyn Lindell, Prentice PARAS, DPM   lisinopril  (ZESTRIL ) tablet 20 mg, 20 mg, Oral, Daily, Acheampong, Peter K, MD, 20 mg at 08/26/24 0920   vancomycin (VANCOREADY) IVPB 1250 mg/250 mL, 1,250 mg, Intravenous, Q12H, Acheampong, Maude POUR, MD, Last Rate: 166.7 mL/hr at 08/26/24 0608, 1,250 mg at 08/26/24 0608  Social History   Tobacco Use  Smoking Status Never  Smokeless Tobacco Never    No Known Allergies Objective:   Vitals:   08/26/24 0456 08/26/24 0754  BP: 110/71 117/77  Pulse: 72 66  Resp: 17 20  Temp: 98.1 F (36.7 C) 98.1 F (36.7 C)  SpO2: 99% 99%   Body mass index is 28.48 kg/m. Constitutional Well developed. Well nourished. Oriented to person, place, and time.  Vascular Dorsalis pedis pulses faintly-palpable bilaterally. Posterior tibial pulses faintly-palpable bilaterally. Capillary refill normal to all digits.  No cyanosis or clubbing noted. Pedal hair growth normal.  Neurologic Normal speech. Gross sensation intact. Motor  function intact.  Negative tinel sign at tarsal tunnel bilaterally.   Dermatologic Skin texture and turgor are within normal limits.  Open wound present to medial left hallux.  Measures 2.0 x 2.5 x 0.4 cm.  Granular base.  Negative probe to bone.  Small fluctuant area just proximal to ulceration.  Minor malodor.  Erythema improved, extending to base of hallux and improving in forefoot.  Musculoskeletal: L - No gross deformities. Compartments soft, non-tender, compressible     MRI 08/25/24: MPRESSION: 1. Soft tissue wound medial to the interphalangeal joint of the great toe with underlying ill-defined fluid collection suspicious for a small abscess. 2. Abnormal marrow signal and enhancement within the adjacent distal phalanx of the great toe and distal half of the proximal phalanx of the great toe, suspicious for early osteomyelitis. 3. Nonspecific mild flexor tenosynovitis at the level of the midfoot. Forefoot muscular T2 hyperintensity, likely related to underlying diabetes.  Assessment:   1. Uncontrolled type 2 diabetes mellitus with hyperglycemia (HCC)   2. Cellulitis of left foot   3. Open wound of left great toe, initial encounter    Plan:  - Patient was evaluated and treated and all questions answered.  Left hallux wound with early osteomyelitis and abscess changes via MRI - Patient had discussion about various treatment options.  I did discuss that the recommended treatment for this is amputation for definitive resolution of infection.  We did alternatively discuss conservative therapy with antibiotics.  He is interested in pursuing conservative therapy.  - Performed debridement yesterday and cultured purulent pocket, unfortunately it does not appear that the culture was sent. Residual pocket identified on MRI. Bedside I&D performed today as described below, new culture taken and sent.  Order for culture in place. - Recommend ID consult for antibiotic management - ABI pending - Emphasized importance of glucose control - Once daily betadine wet to dry dressing changes. Order placed. Upon discharge, patient to continue once daily wet to dry betadine dressing changes.  - Packing will be removed tomorrow, will likely be stable for discharge from podiatric standpoint after that pending continued clinical improvement  Procedure: Incision and drainage, left hallux wound - Consent was verbally obtained.  All risks, benefits, possible outcomes were  explained to the patient. - The hallux was anesthesized utilizing 1% lidocaine  plain - Attention was directed to the proximal aspect of the left hallux wound.  A small fluctuant area was noted just proximal to wound. Utilizing scissors it was connected to the primary wound.  Purulence was found and culture taken. A 0.5cm incision was made at proximal aspect of purulent pocket. The area was explored and no further purulence or fluctuant areas were noted.  The area was then flushed utilizing normal sterile saline. A gauze was routed from the main wound to the stab incision to allow the area to remain open and continue to drain.  A Betadine wet-to-dry dressing was then applied.  This is to be replaced once daily.   Prentice Ovens, DPM AACFAS Fellowship Trained Podiatric Surgeon [facility]

## 2024-08-27 DIAGNOSIS — L03116 Cellulitis of left lower limb: Secondary | ICD-10-CM

## 2024-08-27 DIAGNOSIS — M869 Osteomyelitis, unspecified: Secondary | ICD-10-CM

## 2024-08-27 DIAGNOSIS — S91102A Unspecified open wound of left great toe without damage to nail, initial encounter: Secondary | ICD-10-CM

## 2024-08-27 LAB — GLUCOSE, CAPILLARY
Glucose-Capillary: 161 mg/dL — ABNORMAL HIGH (ref 70–99)
Glucose-Capillary: 216 mg/dL — ABNORMAL HIGH (ref 70–99)
Glucose-Capillary: 219 mg/dL — ABNORMAL HIGH (ref 70–99)
Glucose-Capillary: 219 mg/dL — ABNORMAL HIGH (ref 70–99)
Glucose-Capillary: 206 mg/dL — ABNORMAL HIGH (ref 70–99)

## 2024-08-27 MED ORDER — INSULIN STARTER KIT- PEN NEEDLES (ENGLISH)
1.0000 | Freq: Once | Status: AC
  Administered 2024-08-27: 1
  Filled 2024-08-27: qty 1

## 2024-08-27 MED ORDER — INSULIN ASPART 100 UNIT/ML IJ SOLN
3.0000 [IU] | Freq: Three times a day (TID) | INTRAMUSCULAR | Status: DC
  Administered 2024-08-27 – 2024-08-28 (×3): 3 [IU] via SUBCUTANEOUS
  Filled 2024-08-27 (×3): qty 3

## 2024-08-27 MED ORDER — LIVING WELL WITH DIABETES BOOK
Freq: Once | Status: DC
  Filled 2024-08-27 (×2): qty 1

## 2024-08-27 NOTE — Progress Notes (Signed)
 Progress Note   Patient: Jorge Mason FMW:[id number] DOB: November 13, 1970 DOA: 08/25/2024     1 DOS: the patient was seen and examined on 08/27/2024   Brief hospital course: Jorge Mason is a 53 y.o. male with medical history significant of hypertension, diabetes mellitus, coronary disease status post PCI and stent.  Currently on dual antiplatelet therapy with aspirin  and Plavix .  Patient has been in his usual state of health until about 2 weeks ago when he saw a blister on his left foot.  He took the skin off but has noted worsening foot ulcer.  MRI of the foot showed early osteomyelitis with a small abscess. Patient has been seen by podiatry, had a debridement.  Decision was made not to perform amputation, seen by ID, currently treated with Zyvox and Unasyn. Final culture results still pending from the wound.   Principal Problem:   Diabetic foot ulcer (HCC) Active Problems:   Uncontrolled type 2 diabetes mellitus with hyperglycemia, without long-term current use of insulin  (HCC)   Hypertension   History of CVA 07/2021(cerebrovascular accident)   Overweight (BMI 25.0-29.9)   Osteomyelitis of great toe of left foot (HCC)   CAD (coronary artery disease)   Diabetic foot infection (HCC)   Uncontrolled type 2 diabetes mellitus with hyperglycemia (HCC)   Assessment and Plan: Diabetic foot infection with osteomyelitis of the left great toe. MRI showed small abscess with osteomyelitis of the left great toe.  Has been evaluated by podiatry, may need great toe amputation. Continue current antibiotics with vancomycin and Unasyn. So far blood culture negative.  Seen by ID, changed vancomycin to Zyvox.  Wound cultures so far growing gram-negative rods as well as gram-positive cocci.  Final culture result still pending. Patient has no insurance, will prefer have the final culture results and get antibiotics filled before discharge.   Uncontrolled type 2 diabetes with hyperglycemia. Patient  most recent A1c was in the last year, 11.4.  Glucose running high now.  Recheck A1c at 13.0.  Increase insulin  dose.  Patient will need diabetic teaching before discharge.  Will also set up PCP.   History of stroke. Continue to follow.   Essential hypertension. On lisinopril        Subjective:  Patient doing well, no significant pain.  No fever or chills.  Physical Exam: Vitals:   08/26/24 1534 08/26/24 2041 08/27/24 0337 08/27/24 0809  BP: 126/82 130/80 110/66 119/75  Pulse: 73 82 (!) 59 73  Resp: 17 20 19 16   Temp: 98.6 F (37 C) 98.3 F (36.8 C) (!) 97.3 F (36.3 C) 98 F (36.7 C)  TempSrc:    Oral  SpO2: 96% 97% 97% 98%  Weight:      Height:       General exam: Appears calm and comfortable  Respiratory system: Clear to auscultation. Respiratory effort normal. Cardiovascular system: S1 & S2 heard, RRR. No JVD, murmurs, rubs, gallops or clicks. No pedal edema. Gastrointestinal system: Abdomen is nondistended, soft and nontender. No organomegaly or masses felt. Normal bowel sounds heard. Central nervous system: Alert and oriented. No focal neurological deficits. Extremities: Symmetric 5 x 5 power. Skin: No rashes, lesions or ulcers Psychiatry: Judgement and insight appear normal. Mood & affect appropriate.    Data Reviewed:  Lab results reviewed.  Family Communication: None  Disposition: Status is: Inpatient Remains inpatient appropriate because: Severity of disease, IV treatment.     Time spent: 35 minutes  Author: Murvin Mana, MD 08/27/2024 2:42 PM  For on  call review [email or web address].

## 2024-08-27 NOTE — Progress Notes (Signed)
 Subjective:  Patient ID: Jorge Mason, male    DOB: 04/24/1971,  MRN: [id number]  Chief Complaint  Patient presents with   Wound Check    53 y.o. male in hospital for left hallux wound.  I evaluated him yesterday and performed a bedside I&D with culture of purulent fluid. His culture is pending, showing moderate gram negative rods currently.  He states that the area is tender but improved compared to pre-debridement. No other concerns.   Interval history 08/25/24:  53 y.o. male presented for the to the emergency department for a left foot wound.  He states that approximately 2 weeks ago he was trying to self trim the callus and pulled off deeper skin than he is used to.  Since that time, he has had increasing redness to the big toe.  He states that he was under the impression that his diabetes was well-controlled, on admission it was found to be 405.   Review of Systems: Negative except as noted in the HPI. Denies N/V/F/Ch.  Past Medical History:  Diagnosis Date   Diabetes mellitus without complication (HCC)    Hypertension    Neuropathy     Current Facility-Administered Medications:    acetaminophen  (TYLENOL ) tablet 650 mg, 650 mg, Oral, Q6H PRN **OR** acetaminophen  (TYLENOL ) suppository 650 mg, 650 mg, Rectal, Q6H PRN, Acheampong, Peter K, MD   albuterol (PROVENTIL) (2.5 MG/3ML) 0.083% nebulizer solution 2.5 mg, 2.5 mg, Nebulization, Q6H PRN, Acheampong, Peter K, MD   Ampicillin-Sulbactam (UNASYN) 3 g in sodium chloride  0.9 % 100 mL IVPB, 3 g, Intravenous, Q6H, Acheampong, Maude POUR, MD, Last Rate: 200 mL/hr at 08/27/24 0603, 3 g at 08/27/24 0603   aspirin  EC tablet 81 mg, 81 mg, Oral, Daily, Acheampong, Peter K, MD, 81 mg at 08/27/24 9165   atorvastatin  (LIPITOR ) tablet 80 mg, 80 mg, Oral, QHS, Acheampong, Peter K, MD, 80 mg at 08/26/24 2039   enoxaparin  (LOVENOX ) injection 40 mg, 40 mg, Subcutaneous, Q24H, Acheampong, Peter K, MD, 40 mg at 08/26/24 1650   hydrALAZINE  (APRESOLINE )  injection 5 mg, 5 mg, Intravenous, Q6H PRN, Acheampong, Peter K, MD   HYDROcodone -acetaminophen  (NORCO/VICODIN) 5-325 MG per tablet 1-2 tablet, 1-2 tablet, Oral, Q4H PRN, Acheampong, Maude POUR, MD, 2 tablet at 08/27/24 0051   insulin  aspart (novoLOG ) injection 0-15 Units, 0-15 Units, Subcutaneous, TID WC, Acheampong, Peter K, MD, 3 Units at 08/27/24 0834   insulin  glargine-yfgn [facility]) injection 12 Units, 12 Units, Subcutaneous, Daily, Zhang, Dekui, MD, 12 Units at 08/27/24 0835   linezolid (ZYVOX) tablet 600 mg, 600 mg, Oral, Q12H, Ravishankar, Donald, MD, 600 mg at 08/27/24 9165   lisinopril  (ZESTRIL ) tablet 20 mg, 20 mg, Oral, Daily, Acheampong, Peter K, MD, 20 mg at 08/27/24 9166  Social History   Tobacco Use  Smoking Status Never  Smokeless Tobacco Never    No Known Allergies Objective:   Vitals:   08/27/24 0337 08/27/24 0809  BP: 110/66 119/75  Pulse: (!) 59 73  Resp: 19 16  Temp: (!) 97.3 F (36.3 C) 98 F (36.7 C)  SpO2: 97% 98%   Body mass index is 28.48 kg/m. Constitutional Well developed. Well nourished. Oriented to person, place, and time.  Vascular Dorsalis pedis pulses faintly-palpable bilaterally. Posterior tibial pulses faintly-palpable bilaterally. Capillary refill normal to all digits.  No cyanosis or clubbing noted. Pedal hair growth normal.  Neurologic Normal speech. Gross sensation intact. Motor function intact.  Negative tinel sign at tarsal tunnel bilaterally.   Dermatologic Skin texture and turgor  are within normal limits.  Open wound present to medial left hallux.  Measures 2.0 x 2.5 x 0.4 cm.  Granular base.  Negative probe to bone.  No fluctuant area. No purulence with exsanguination.  No malodor.  Erythema improved, extending to base of hallux and improving in forefoot.  Musculoskeletal: L - No gross deformities. Compartments soft, non-tender, compressible    MRI 08/25/24: MPRESSION: 1. Soft tissue wound medial to the interphalangeal joint  of the great toe with underlying ill-defined fluid collection suspicious for a small abscess. 2. Abnormal marrow signal and enhancement within the adjacent distal phalanx of the great toe and distal half of the proximal phalanx of the great toe, suspicious for early osteomyelitis. 3. Nonspecific mild flexor tenosynovitis at the level of the midfoot. Forefoot muscular T2 hyperintensity, likely related to underlying diabetes.  Culture 08/26/24: Specimen Information: Abscess  0 Result Notes    Component Ref Range & Units (hover) 1 d ago  Specimen Description ABSCESS Performed at [facility], [address], [place], [place] [address]  Special Requests NONE Performed at [facility], [address] [place], [place] [address]  Gram Stain RARE WBC SEEN RARE VONNE POSITIVE COCCI RARE GRAM NEGATIVE RODS  Culture MODERATE GRAM NEGATIVE RODS CULTURE REINCUBATED FOR BETTER GROWTH SUSCEPTIBILITIES TO FOLLOW Performed at [facility], [address] [place], [place] [address]      Assessment:   1. Uncontrolled type 2 diabetes mellitus with hyperglycemia (HCC)   2. Cellulitis of left foot   3. Open wound of left great toe, initial encounter    Plan:  - Patient was evaluated and treated and all questions answered.  Left hallux wound with early osteomyelitis and abscess changes via MRI - Patient had discussion about various treatment options.  I did discuss that the recommended treatment for this is amputation for definitive resolution of infection.  We did alternatively discuss conservative therapy with antibiotics.  He is interested in pursuing conservative therapy. He has discussed this with ID. Antibiotics per ID service.  - Culture from I&D yesterday in progress. Results above.  - ABI pending - Emphasized importance of glucose control - Once daily betadine wet to dry dressing changes. Order placed. Upon discharge, patient to continue once daily wet to dry  betadine dressing changes. I replaced the dressing today.  - Packing removed today. Patient stable for discharge from podiatric standpoint. He is scheduled for follow-up with me 09/05/24.     Prentice Ovens, DPM AACFAS Fellowship Trained Podiatric Surgeon [facility]

## 2024-08-27 NOTE — Plan of Care (Signed)
  Problem: Coping: Goal: Ability to adjust to condition or change in health will improve Outcome: Progressing   Problem: Fluid Volume: Goal: Ability to maintain a balanced intake and output will improve Outcome: Progressing   Problem: Skin Integrity: Goal: Risk for impaired skin integrity will decrease Outcome: Progressing   Problem: Coping: Goal: Level of anxiety will decrease Outcome: Progressing   Problem: Pain Managment: Goal: General experience of comfort will improve and/or be controlled Outcome: Progressing   Problem: Safety: Goal: Ability to remain free from injury will improve Outcome: Progressing

## 2024-08-27 NOTE — TOC CM/SW Note (Signed)
 Transition of Care [facility]) CM/SW Note   Transition of Care [facility]) - Inpatient Brief Assessment   Patient Details  Name: Jorge Mason MRN: [id number] Date of Birth: [birth year]  Transition of Care [facility]) CM/SW Contact:    Alvaro Louder, LCSW Phone Number: 08/27/2024, 3:58 PM   Clinical Narrative: Per Consult PCP resources placed in AVS. LCSWA will continue to follow. No other TOC needs at this time. Please consult if they arise.  TOC to follow for discharge.   Transition of Care Asessment: Insurance and Status: Selfpay Patient has primary care physician: No Home environment has been reviewed: single family home Prior level of function:: Ox4 Prior/Current Home Services: No current home services Social Drivers of Health Review: SDOH reviewed needs interventions Readmission risk has been reviewed: Yes Transition of care needs: transition of care needs identified, TOC will continue to follow

## 2024-08-27 NOTE — Discharge Instructions (Signed)
 Some PCP options in [place] area- not a comprehensive list  [facility]- [phone] [facility]- [phone] [facility]- [phone] [facility]- [phone] [facility]- [phone] [facility]- [phone]  or [facility] Physician Referral Line [phone]

## 2024-08-27 NOTE — Inpatient Diabetes Management (Signed)
 Inpatient Diabetes Program Recommendations  AACE/ADA: New Consensus Statement on Inpatient Glycemic Control (2015)  Target Ranges:  Prepandial:   less than 140 mg/dL      Peak postprandial:   less than 180 mg/dL (1-2 hours)      Critically ill patients:  140 - 180 mg/dL   Lab Results  Component Value Date   GLUCAP 219 (H) 08/27/2024   HGBA1C 13.0 (H) 08/26/2024    Latest Reference Range & Units 08/26/24 08:23 08/26/24 11:54 08/26/24 16:36 08/26/24 22:00 08/27/24 08:12 08/27/24 11:58 08/27/24 12:32  Glucose-Capillary 70 - 99 mg/dL 808 (H) 754 (H) 775 (H) 216 (H) 161 (H) 216 (H) 219 (H)  (H): Data is abnormally high  Diabetes history: DM2 Outpatient Diabetes medications:  Metformin 1 gm bid  Current orders for Inpatient glycemic control: Semglee 12 units daily Novolog  0-15 units tid correction  Inpatient Diabetes Program Recommendations:   Postprandial CBGs elevated. Please consider: -Add Novolog  3 units tid meal coverage if eats 50% -Add Novolog  0-5 units hs correction  Noted A1c results 13.0 (average blood glucose 326 over the past 2-3 months). Ordered Living Well With Diabetes booklet, insulin  pen starter kit, and transition of care consult (no insurance listed). No PCP listed.  Spoke with patient regarding diabetes management. Reviewed A1c of 13.2 (average blood glucose 326 over the past 2-3 months). Patient has been drinking sweet tea and lemonade. Discussed limiting sugary drinks and high carbohydrate foods. Patient states willingness to follow recommendations. Patient does not have a PCP and no insurance. Needs a glucose meter and supplies as well.  Nurses, please allow patient to start giving insulin  injections with education on injections.  Will plan to teach patient insulin  pen in am. Sent patient a video on how to give an insulin  pen injection, attached exit care instructions.  Thank you, Jorge Mcgregory E. Coyle Stordahl, RN, MSN, CNS, CDCES  Diabetes Coordinator Inpatient Glycemic  Control Team Team Pager [phone] (8am-5pm) 08/27/2024 2:18 PM

## 2024-08-27 NOTE — Progress Notes (Signed)
 Date of Admission:  08/25/2024      ID: Jorge Mason is a 53 y.o. male  Principal Problem:   Diabetic foot ulcer (HCC) Active Problems:   Hypertension   History of CVA 07/2021(cerebrovascular accident)   Uncontrolled type 2 diabetes mellitus with hyperglycemia, without long-term current use of insulin  (HCC)   Overweight (BMI 25.0-29.9)   Osteomyelitis of great toe of left foot (HCC)   CAD (coronary artery disease)   Diabetic foot infection (HCC)   Uncontrolled type 2 diabetes mellitus with hyperglycemia (HCC)    Subjective: Feeling better  Medications:   aspirin  EC  81 mg Oral Daily   atorvastatin   80 mg Oral QHS   enoxaparin  (LOVENOX ) injection  40 mg Subcutaneous Q24H   insulin  aspart  0-15 Units Subcutaneous TID WC   insulin  glargine-yfgn  12 Units Subcutaneous Daily   insulin  starter kit- pen needles  1 kit Other Once   linezolid  600 mg Oral Q12H   lisinopril   20 mg Oral Daily   living well with diabetes book   Does not apply Once    Objective: Vital signs in last 24 hours: Patient Vitals for the past 24 hrs:  BP Temp Temp src Pulse Resp SpO2  08/27/24 0809 119/75 98 F (36.7 C) Oral 73 16 98 %  08/27/24 0337 110/66 (!) 97.3 F (36.3 C) -- (!) 59 19 97 %  08/26/24 2041 130/80 98.3 F (36.8 C) -- 82 20 97 %  08/26/24 1534 126/82 98.6 F (37 C) -- 73 17 96 %       PHYSICAL EXAM:  General: Alert, cooperative, no distress, appears stated age.   Lungs: Clear to auscultation bilaterally. No Wheezing or Rhonchi. No rales. Heart: Regular rate and rhythm, no murmur, rub or gallop. Abdomen: Soft, non-tender,not distended. Bowel sounds normal. No masses Extremities: left foot dressing removed Great toe swelling and redness improving Wound on the plantar surface dry     Skin: No rashes or lesions. Or bruising Lymph: Cervical, supraclavicular normal. Neurologic: Grossly non-focal  Lab Results    Latest Ref Rng & Units 08/26/2024    6:23 AM  08/25/2024   11:51 AM 05/22/2024    9:00 AM  CBC  WBC 4.0 - 10.5 K/uL 9.0  10.5  8.0   Hemoglobin 13.0 - 17.0 g/dL 88.0  87.7  87.0   Hematocrit 39.0 - 52.0 % 36.6  37.0  38.6   Platelets 150 - 400 K/uL 315  319  246        Latest Ref Rng & Units 08/26/2024    6:23 AM 08/25/2024   12:50 PM 08/25/2024   11:51 AM  CMP  Glucose 70 - 99 mg/dL 782   594   BUN 6 - 20 mg/dL 16   20   Creatinine 9.38 - 1.24 mg/dL 8.94   8.84   Sodium 864 - 145 mmol/L 135   134   Potassium 3.5 - 5.1 mmol/L 4.0   4.0   Chloride 98 - 111 mmol/L 101   97   CO2 22 - 32 mmol/L 26   25   Calcium  8.9 - 10.3 mg/dL 8.6   9.1   Total Protein 6.5 - 8.1 g/dL  7.4    Total Bilirubin 0.0 - 1.2 mg/dL  1.2    Alkaline Phos 38 - 126 U/L  105    AST 15 - 41 U/L  16    ALT 0 - 44 U/L  12  Microbiology: Wound culture pending Studies/Results: MR FOOT LEFT W WO CONTRAST Result Date: 08/26/2024 CLINICAL DATA:  Foot pain, chronic, inflammatory arthritis suspected, xray done History provided for earlier radiographs was a left great toe wound for 2 weeks with history of underlying diabetes. EXAM: MRI OF THE LEFT FOREFOOT WITHOUT AND WITH CONTRAST TECHNIQUE: Multiplanar, multisequence MR imaging of the left forefoot was performed both before and after administration of intravenous contrast. CONTRAST:  9mL GADAVIST GADOBUTROL 1 MMOL/ML IV SOLN COMPARISON:  Radiographs 08/25/2024 FINDINGS: Bones/Joint/Cartilage Focal skin ulceration medial to the interphalangeal joint of the great toe. The soft tissue findings are further described below. Abnormal T2 hyperintensity and low level enhancement within the adjacent distal phalanx of the great toe as well as the distal half of the proximal phalanx. No definite cortical destruction or T1 marrow abnormality. There is a small interphalangeal joint effusion. No significant effusion at the 1st metatarsophalangeal joint. The tibial sesamoid of the 1st metatarsal is bipartite without suspicious  marrow abnormality. The additional metatarsals and digits appear unremarkable. Ligaments Intact Lisfranc ligament. The collateral ligaments of the metatarsophalangeal joints are intact. Muscles and Tendons T2 hyperintensity throughout the forefoot musculature. There is a small amount of fluid within the flexor hallucis and digitorum longus tendon sheath at the level of the midfoot. No associated suspicious synovial enhancement. Soft tissues As above, soft tissue wound medial to the interphalangeal joint of the great toe. There is an underlying ill-defined fluid collection with peripheral enhancement, measuring approximately 1.9 x 1.6 x 0.7 cm. There is surrounding soft tissue edema and enhancement without additional focal fluid collection. No evidence of foreign body or soft tissue emphysema. IMPRESSION: 1. Soft tissue wound medial to the interphalangeal joint of the great toe with underlying ill-defined fluid collection suspicious for a small abscess. 2. Abnormal marrow signal and enhancement within the adjacent distal phalanx of the great toe and distal half of the proximal phalanx of the great toe, suspicious for early osteomyelitis. 3. Nonspecific mild flexor tenosynovitis at the level of the midfoot. Forefoot muscular T2 hyperintensity, likely related to underlying diabetes. Electronically Signed   By: Elsie Perone M.D.   On: 08/26/2024 08:13     Assessment/Plan: Left great toe  infection with poorly controlled Diabetes mellitus   Abscess great toe with cellulitis and open wound- MRI questioning early osteo Likely Staph /or strep gram neg rods seen Pt currently on linezolid  and unasyn- Awaiting culture result e HE has no insurance , and may not be able to pay out of pocket for linezolid Minimum duration is 2 weeks and depending on his progress will extend antibiotics  if culture still not available tomorrow then may have to send him on levaquin+ linezolid and follow in ID clinic  DM poorly  controlled Pt was taking Metformin as OP On insulin  here   CAD s/p LAD stent- was on atorvastatin , clopidogrel , aspirin    HTN on lisinopril    Discussed the management with the patient in detail   Discussed with care team   Dr.Fitzgerald taking over from tomorrow ?

## 2024-08-28 ENCOUNTER — Other Ambulatory Visit: Payer: Self-pay

## 2024-08-28 DIAGNOSIS — E663 Overweight: Secondary | ICD-10-CM

## 2024-08-28 DIAGNOSIS — B964 Proteus (mirabilis) (morganii) as the cause of diseases classified elsewhere: Secondary | ICD-10-CM

## 2024-08-28 DIAGNOSIS — I10 Essential (primary) hypertension: Secondary | ICD-10-CM

## 2024-08-28 DIAGNOSIS — B952 Enterococcus as the cause of diseases classified elsewhere: Secondary | ICD-10-CM

## 2024-08-28 DIAGNOSIS — Z8673 Personal history of transient ischemic attack (TIA), and cerebral infarction without residual deficits: Secondary | ICD-10-CM

## 2024-08-28 LAB — GLUCOSE, CAPILLARY
Glucose-Capillary: 205 mg/dL — ABNORMAL HIGH (ref 70–99)
Glucose-Capillary: 212 mg/dL — ABNORMAL HIGH (ref 70–99)
Glucose-Capillary: 244 mg/dL — ABNORMAL HIGH (ref 70–99)
Glucose-Capillary: 253 mg/dL — ABNORMAL HIGH (ref 70–99)

## 2024-08-28 MED ORDER — ATORVASTATIN CALCIUM 80 MG PO TABS
80.0000 mg | ORAL_TABLET | Freq: Every day | ORAL | Status: DC
  Administered 2024-08-28: 80 mg via ORAL
  Filled 2024-08-28: qty 1

## 2024-08-28 MED ORDER — INSULIN ASPART 100 UNIT/ML IJ SOLN
4.0000 [IU] | Freq: Three times a day (TID) | INTRAMUSCULAR | Status: DC
  Administered 2024-08-28 – 2024-08-29 (×3): 4 [IU] via SUBCUTANEOUS
  Filled 2024-08-28 (×3): qty 4

## 2024-08-28 NOTE — Inpatient Diabetes Management (Signed)
 Inpatient Diabetes Program Recommendations  AACE/ADA: New Consensus Statement on Inpatient Glycemic Control (2015)  Target Ranges:  Prepandial:   less than 140 mg/dL      Peak postprandial:   less than 180 mg/dL (1-2 hours)      Critically ill patients:  140 - 180 mg/dL   Lab Results  Component Value Date   GLUCAP 205 (H) 08/28/2024   HGBA1C 13.0 (H) 08/26/2024    Latest Reference Range & Units 08/27/24 08:12 08/27/24 11:58 08/27/24 12:32 08/27/24 16:42 08/27/24 21:12 08/28/24 07:54  Glucose-Capillary 70 - 99 mg/dL 838 (H) 783 (H) 780 (H) 219 (H) 206 (H) 205 (H)  (H): Data is abnormally high  Diabetes history: DM2 Outpatient Diabetes medications:  Metformin 1 gm bid  Current orders for Inpatient glycemic control: Semglee 12 units daily Novolog  3 units tid meal coverage Novolog  0-15 units tid correction  Inpatient Diabetes Program Recommendations:   Please consider: -Increase Novolog  meal coverage to 4 units tid if eats 50% meal  Patient does not have insurance, so will plan to get [facility] from [facility]. [facility]ication [facility] has Basaglar and Humalog. Educated patient on insulin  pen use at home. Reviewed contents of insulin  flexpen starter kit. Reviewed all steps of insulin  pen including attachment of needle, 2-unit air shot, dialing up dose, giving injection, removing needle, disposal of sharps, storage of unused insulin , disposal of insulin  etc. Patient able to provide successful return demonstration. Also reviewed troubleshooting with insulin  pen. MD to give patient Rxs for insulin  pens and insulin  pen needles.   Planned to place CGM Libre 3+ for patient, but patient unable to remember Apple password to download app. Needed. Will send home for application if fiance cannot help with password while pt. Is in the hospital.  Thank you, Fardowsa Authier E. Inocencia Murtaugh, RN, MSN, CNS, CDCES  Diabetes Coordinator Inpatient Glycemic Control Team Team Pager [phone] (8am-5pm) 08/28/2024  11:00 AM

## 2024-08-28 NOTE — Assessment & Plan Note (Signed)
 BMI 28.48

## 2024-08-28 NOTE — Progress Notes (Addendum)
 Progress Note   Patient: Jorge Mason DOB: Jan 23, 1971 DOA: 08/25/2024     2 DOS: the patient was seen and examined on 08/28/2024   Brief hospital course: Jorge Mason is a 53 y.o. male with medical history significant of hypertension, diabetes mellitus, coronary disease status post PCI and stent.  Currently on dual antiplatelet therapy with aspirin  and Plavix .  Patient has been in his usual state of health until about 2 weeks ago when he saw a blister on his left foot.  He took the skin off but has noted worsening foot ulcer.  MRI of the foot showed early osteomyelitis with a small abscess. Patient has been seen by podiatry, had a debridement.  Decision was made not to perform amputation, seen by ID, currently treated with Zyvox and Unasyn. Final culture results still pending from the wound.  12/3.  Moderate Proteus mirabilis and moderate Enterococcus faecalis growing out of wound culture sensitivities pending.  Diabetic teaching on how to inject insulin  done by diabetes coordinator.    Assessment and Plan: * Osteomyelitis of great toe of left foot [facility]) Patient decided on conservative therapy.  Patient had a bedside I&D.  Did not want amputation at this point.  Currently on Unasyn and Zyvox.  Wound culture growing Proteus and Enterococcus but sensitivities still pending.  ID following.  Uncontrolled type 2 diabetes mellitus with hyperglycemia, without long-term current use of insulin  (HCC) Hemoglobin A1c elevated at 13.  Patient currently on Semglee insulin  and will increase NovoLog  to 4 units 3 times daily plus sliding scale.  Sugars still in the 200s.  Diabetic diet discussed  Overweight (BMI 25.0-29.9) BMI 28.48  History of stroke Continue to follow  Hypertension On lisinopril         Subjective: Patient feeling okay.  Offers no complaints.  Admitted with foot infection found to have diabetes uncontrolled.  Physical Exam: Vitals:   08/27/24 2045  08/28/24 0452 08/28/24 0753 08/28/24 1523  BP: (!) 141/83 124/80 112/72 122/77  Pulse: 86 68 69 78  Resp: 17 17 19 19   Temp: 99.2 F (37.3 C) 98.6 F (37 C) 98.4 F (36.9 C) 98.6 F (37 C)  TempSrc: Oral     SpO2: 97% 99% 96% 97%  Weight:      Height:       Physical Exam HENT:     Head: Normocephalic.     Mouth/Throat:     Pharynx: No oropharyngeal exudate.  Eyes:     General: Lids are normal.     Conjunctiva/sclera: Conjunctivae normal.  Cardiovascular:     Rate and Rhythm: Normal rate and regular rhythm.     Heart sounds: Normal heart sounds, S1 normal and S2 normal.  Pulmonary:     Breath sounds: No decreased breath sounds, wheezing, rhonchi or rales.  Abdominal:     Palpations: Abdomen is soft.     Tenderness: There is no abdominal tenderness.  Musculoskeletal:     Right lower leg: No swelling.     Left lower leg: No swelling.  Skin:    General: Skin is warm.     Comments: Right foot covered  Neurological:     Mental Status: He is alert and oriented to person, place, and time.     Data Reviewed: Fingersticks in the 200s, creatinine 1.05, electrolytes normal range, white blood cell count 9.0, hemoglobin 11.9, platelet count 315  Disposition: Status is: Inpatient Remains inpatient appropriate because: Infectious disease team to set up plan for antibiotics  upon discharge.  Planned Discharge Destination: Home    Time spent: 28 minutes  Author: Charlie Patterson, MD 08/28/2024 4:01 PM  For on call review [email or web address].

## 2024-08-28 NOTE — Assessment & Plan Note (Addendum)
 Hemoglobin A1c elevated at 13.  Patient currently on Semglee  insulin  and will increase to 15 units daily.  NovoLog  to 6 units units 3 times daily plus sliding scale.  Diabetic diet discussed.  Diabetes coordinator taught the patient how to inject insulin .  Prescribed pens through the [facility] pharmacy.  Prescribed diabetic supplies through the [facility] pharmacy.

## 2024-08-28 NOTE — Progress Notes (Signed)
 INFECTIOUS DISEASE PROGRESS NOTE Date of Admission:  08/25/2024     ID: Jorge Mason is a 53 y.o. male with  DFI Principal Problem:   Diabetic foot ulcer (HCC) Active Problems:   Hypertension   History of CVA 07/2021(cerebrovascular accident)   Uncontrolled type 2 diabetes mellitus with hyperglycemia, without long-term current use of insulin  (HCC)   Overweight (BMI 25.0-29.9)   Osteomyelitis of great toe of left foot (HCC)   CAD (coronary artery disease)   Diabetic foot infection (HCC)   Uncontrolled type 2 diabetes mellitus with hyperglycemia (HCC)   Open wound of left great toe   Cellulitis of left foot   Subjective: No fever.  Cultures are growing moderate Proteus Enterococcus faecalis.  ROS  Eleven systems are reviewed and negative except per hpi  Medications:  Antibiotics Given (last 72 hours)     Date/Time Action Medication Dose Rate   08/25/24 1434 New Bag/Given   ceFEPIme (MAXIPIME) 2 g in sodium chloride  0.9 % 100 mL IVPB 2 g 200 mL/hr   08/25/24 1529 New Bag/Given   vancomycin (VANCOCIN) IVPB 1000 mg/200 mL premix 1,000 mg 200 mL/hr   08/25/24 1826 New Bag/Given   vancomycin (VANCOREADY) IVPB 1250 mg/250 mL 1,250 mg 166.7 mL/hr   08/25/24 1826 New Bag/Given   Ampicillin-Sulbactam (UNASYN) 3 g in sodium chloride  0.9 % 100 mL IVPB 3 g 200 mL/hr   08/25/24 2236 New Bag/Given   Ampicillin-Sulbactam (UNASYN) 3 g in sodium chloride  0.9 % 100 mL IVPB 3 g 200 mL/hr   08/26/24 0603 New Bag/Given   Ampicillin-Sulbactam (UNASYN) 3 g in sodium chloride  0.9 % 100 mL IVPB 3 g 200 mL/hr   08/26/24 0608 New Bag/Given   vancomycin (VANCOREADY) IVPB 1250 mg/250 mL 1,250 mg 166.7 mL/hr   08/26/24 1244 New Bag/Given   Ampicillin-Sulbactam (UNASYN) 3 g in sodium chloride  0.9 % 100 mL IVPB 3 g 200 mL/hr   08/26/24 1649 New Bag/Given   Ampicillin-Sulbactam (UNASYN) 3 g in sodium chloride  0.9 % 100 mL IVPB 3 g 200 mL/hr   08/26/24 1650 New Bag/Given   vancomycin (VANCOREADY)  IVPB 1250 mg/250 mL 1,250 mg 166.7 mL/hr   08/26/24 2039 Given   linezolid (ZYVOX) tablet 600 mg 600 mg    08/27/24 0041 New Bag/Given   Ampicillin-Sulbactam (UNASYN) 3 g in sodium chloride  0.9 % 100 mL IVPB 3 g 200 mL/hr   08/27/24 0603 New Bag/Given   Ampicillin-Sulbactam (UNASYN) 3 g in sodium chloride  0.9 % 100 mL IVPB 3 g 200 mL/hr   08/27/24 0834 Given   linezolid (ZYVOX) tablet 600 mg 600 mg    08/27/24 1215 New Bag/Given   Ampicillin-Sulbactam (UNASYN) 3 g in sodium chloride  0.9 % 100 mL IVPB 3 g 200 mL/hr   08/27/24 1743 New Bag/Given   Ampicillin-Sulbactam (UNASYN) 3 g in sodium chloride  0.9 % 100 mL IVPB 3 g 200 mL/hr   08/27/24 2118 Given   linezolid (ZYVOX) tablet 600 mg 600 mg    08/28/24 0021 New Bag/Given   Ampicillin-Sulbactam (UNASYN) 3 g in sodium chloride  0.9 % 100 mL IVPB 3 g 200 mL/hr   08/28/24 0503 New Bag/Given   Ampicillin-Sulbactam (UNASYN) 3 g in sodium chloride  0.9 % 100 mL IVPB 3 g 200 mL/hr   08/28/24 0841 Given   linezolid (ZYVOX) tablet 600 mg 600 mg    08/28/24 1233 New Bag/Given   Ampicillin-Sulbactam (UNASYN) 3 g in sodium chloride  0.9 % 100 mL IVPB 3 g 200 mL/hr  aspirin  EC  81 mg Oral Daily   atorvastatin   80 mg Oral QHS   enoxaparin  (LOVENOX ) injection  40 mg Subcutaneous Q24H   insulin  aspart  0-15 Units Subcutaneous TID WC   insulin  aspart  3 Units Subcutaneous TID WC   insulin  glargine-yfgn  12 Units Subcutaneous Daily   linezolid  600 mg Oral Q12H   lisinopril   20 mg Oral Daily   living well with diabetes book   Does not apply Once    Objective: Vital signs in last 24 hours: Temp:  [98.4 F (36.9 C)-99.2 F (37.3 C)] 98.4 F (36.9 C) (12/03 0753) Pulse Rate:  [68-86] 69 (12/03 0753) Resp:  [17-19] 19 (12/03 0753) BP: (112-141)/(72-83) 112/72 (12/03 0753) SpO2:  [96 %-99 %] 96 % (12/03 0753) Physical Exam  Constitutional: He is oriented to person, place, and time. He appears well-developed and well-nourished. No distress.   HENT:  Mouth/Throat: Oropharynx is clear and moist. No oropharyngeal exudate.  Cardiovascular: Normal rate, regular rhythm and normal heart sounds. Exam reveals no gallop and no friction rub.  No murmur heard.  Pulmonary/Chest: Effort normal and breath sounds normal. No respiratory distress. He has no wheezes.  Abdominal: Soft. Bowel sounds are normal. He exhibits no distension. There is no tenderness.  Lymphadenopathy:  He has no cervical adenopathy.  Neurological: He is alert and oriented to person, place, and time.  Skin: Skin is warm and dry. No rash noted. No erythema.  Psychiatric: He has a normal mood and affect. His behavior is normal.           IMPRESSION: 1. Soft tissue wound medial to the interphalangeal joint of the great toe with underlying ill-defined fluid collection suspicious for a small abscess. 2. Abnormal marrow signal and enhancement within the adjacent distal phalanx of the great toe and distal half of the proximal phalanx of the great toe, suspicious for early osteomyelitis. 3. Nonspecific mild flexor tenosynovitis at the level of the midfoot. Forefoot muscular T2 hyperintensity, likely related to underlying diabetes.  Lab Results Recent Labs    08/26/24 0623  WBC 9.0  HGB 11.9*  HCT 36.6*  NA 135  K 4.0  CL 101  CO2 26  BUN 16  CREATININE 1.05    Microbiology: Results for orders placed or performed during the hospital encounter of 08/25/24  Blood culture (routine x 2)     Status: None (Preliminary result)   Collection Time: 08/25/24 12:54 PM   Specimen: BLOOD  Result Value Ref Range Status   Specimen Description BLOOD BLOOD LEFT HAND  Final   Special Requests   Final    BOTTLES DRAWN AEROBIC AND ANAEROBIC Blood Culture adequate volume   Culture   Final    NO GROWTH 3 DAYS Performed at [facility], [address], [place], [place] [address]    Report Status PENDING  Incomplete  Blood culture (routine x 2)     Status: None  (Preliminary result)   Collection Time: 08/25/24 12:54 PM   Specimen: BLOOD  Result Value Ref Range Status   Specimen Description BLOOD BLOOD RIGHT HAND  Final   Special Requests   Final    BOTTLES DRAWN AEROBIC AND ANAEROBIC Blood Culture adequate volume   Culture   Final    NO GROWTH 3 DAYS Performed at [facility], [address] [place], [place] [address]    Report Status PENDING  Incomplete  Aerobic/Anaerobic Culture w Gram Stain (surgical/deep wound)  Status: None (Preliminary result)   Collection Time: 08/26/24 12:35 PM   Specimen: Abscess  Result Value Ref Range Status   Specimen Description   Final    ABSCESS Performed at [facility], [address], [place], [place] [address]    Special Requests   Final    NONE Performed at [facility], [address] [place], [place] [address]    Gram Stain   Final    RARE WBC SEEN RARE VONNE POSITIVE COCCI RARE GRAM NEGATIVE RODS Performed at [facility], [address] [place], [place] [address]    Culture   Final    MODERATE PROTEUS MIRABILIS MODERATE ENTEROCOCCUS FAECALIS SUSCEPTIBILITIES TO FOLLOW NO ANAEROBES ISOLATED; CULTURE IN PROGRESS FOR 5 DAYS    Report Status PENDING  Incomplete    Studies/Results: No results found.  Assessment/Plan: Left great toe  infection with poorly controlled Diabetes mellitus   Abscess great toe with cellulitis and open wound- MRI questioning early osteo Cx with enterococcus faecalis, proteus Pt currently on linezolid  and unasyn.  Can DC the linezolid.   Await sensitivities on the Proteus and Enterococcus and then can decide on best oral antibiotic.   He has no insurance , and may not be able to pay out of pocket for linezolid Minimum duration is 2 weeks and depending on his progress will extend antibiotics   DM poorly controlled Pt was taking Metformin as OP On insulin  here. Lab Results  Component Value Date   HGBA1C 13.0 (H) 08/26/2024     Thank you very much for the consult. Will follow with you.  Alm SHAUNNA Needle   08/28/2024, 1:52 PM

## 2024-08-28 NOTE — Plan of Care (Signed)
   Problem: Coping: Goal: Ability to adjust to condition or change in health will improve Outcome: Progressing   Problem: Nutritional: Goal: Maintenance of adequate nutrition will improve Outcome: Progressing   Problem: Skin Integrity: Goal: Risk for impaired skin integrity will decrease Outcome: Progressing

## 2024-08-28 NOTE — Assessment & Plan Note (Signed)
 Patient decided on conservative therapy.  Patient had a bedside I&D.  Did not want amputation at this point.  Received Unasyn and Zyvox here.  Wound culture growing Proteus and Enterococcus.  Sensitivities came back on wound culture on 12/4.  Infectious disease specialist recommended 2 weeks of Levaquin and Augmentin.  Close follow-up with podiatry.

## 2024-08-28 NOTE — Assessment & Plan Note (Signed)
 On aspirin and Plavix

## 2024-08-28 NOTE — Assessment & Plan Note (Signed)
On lisinopril

## 2024-08-29 ENCOUNTER — Other Ambulatory Visit: Payer: Self-pay

## 2024-08-29 LAB — GLUCOSE, CAPILLARY
Glucose-Capillary: 208 mg/dL — ABNORMAL HIGH (ref 70–99)
Glucose-Capillary: 196 mg/dL — ABNORMAL HIGH (ref 70–99)

## 2024-08-29 MED ORDER — AMOXICILLIN-POT CLAVULANATE 875-125 MG PO TABS
1.0000 | ORAL_TABLET | Freq: Two times a day (BID) | ORAL | 0 refills | Status: AC
  Filled 2024-08-29: qty 28, 14d supply, fill #0

## 2024-08-29 MED ORDER — LANCETS MISC
1.0000 | 0 refills | Status: AC
  Filled 2024-08-29: qty 200, 50d supply, fill #0

## 2024-08-29 MED ORDER — BLOOD GLUCOSE MONITOR SYSTEM W/DEVICE KIT
1.0000 | PACK | 0 refills | Status: AC
  Filled 2024-08-29: qty 1, 30d supply, fill #0

## 2024-08-29 MED ORDER — INSULIN LISPRO (1 UNIT DIAL) 100 UNIT/ML (KWIKPEN)
6.0000 [IU] | PEN_INJECTOR | Freq: Three times a day (TID) | SUBCUTANEOUS | 0 refills | Status: AC
  Filled 2024-08-29: qty 15, 84d supply, fill #0

## 2024-08-29 MED ORDER — LEVOFLOXACIN 500 MG PO TABS
500.0000 mg | ORAL_TABLET | Freq: Every day | ORAL | 0 refills | Status: AC
  Filled 2024-08-29: qty 14, 14d supply, fill #0

## 2024-08-29 MED ORDER — AMOXICILLIN-POT CLAVULANATE 875-125 MG PO TABS
1.0000 | ORAL_TABLET | Freq: Two times a day (BID) | ORAL | Status: DC
  Administered 2024-08-29: 1 via ORAL
  Filled 2024-08-29: qty 1

## 2024-08-29 MED ORDER — CIPROFLOXACIN HCL 500 MG PO TABS: 500.0000 mg | ORAL_TABLET | Freq: Two times a day (BID) | ORAL | Status: DC

## 2024-08-29 MED ORDER — GLUCAGON EMERGENCY 1 MG IJ SOLR
1.0000 mg | INTRAMUSCULAR | 0 refills | Status: AC | PRN
  Filled 2024-08-29: qty 2, 2d supply, fill #0

## 2024-08-29 MED ORDER — BASAGLAR KWIKPEN 100 UNIT/ML ~~LOC~~ SOPN
15.0000 [IU] | PEN_INJECTOR | Freq: Every day | SUBCUTANEOUS | 0 refills | Status: AC
  Filled 2024-08-29: qty 15, 100d supply, fill #0

## 2024-08-29 MED ORDER — BLOOD GLUCOSE TEST VI STRP
1.0000 | ORAL_STRIP | 0 refills | Status: AC
  Filled 2024-08-29: qty 200, 50d supply, fill #0

## 2024-08-29 MED ORDER — LANCET DEVICE MISC
1.0000 | 0 refills | Status: AC
  Filled 2024-08-29: qty 1, 30d supply, fill #0

## 2024-08-29 MED ORDER — PEN NEEDLES 31G X 5 MM MISC
1.0000 | 0 refills | Status: AC
  Filled 2024-08-29: qty 200, 50d supply, fill #0

## 2024-08-29 MED ORDER — LEVOFLOXACIN 500 MG PO TABS
500.0000 mg | ORAL_TABLET | Freq: Every day | ORAL | Status: DC
  Administered 2024-08-29: 500 mg via ORAL
  Filled 2024-08-29: qty 1

## 2024-08-29 NOTE — Inpatient Diabetes Management (Signed)
 Inpatient Diabetes Program Recommendations  AACE/ADA: New Consensus Statement on Inpatient Glycemic Control (2015)  Target Ranges:  Prepandial:   less than 140 mg/dL      Peak postprandial:   less than 180 mg/dL (1-2 hours)      Critically ill patients:  140 - 180 mg/dL   Lab Results  Component Value Date   GLUCAP 208 (H) 08/29/2024   HGBA1C 13.0 (H) 08/26/2024    Review of Glycemic Control  Diabetes history: DM2 Outpatient Diabetes medications:  Metformin 1 gm bid  Current orders for Inpatient glycemic control: Semglee 12 units daily Novolog  4 units tid meal coverage Novolog  0-15 units tid correction  Inpatient Diabetes Program Recommendations:    Please consider: -Increase Semglee to 15 units daily -Increase Novolog  meal coverage to 6 units tid if eats 50%  Thank you, Dagoberto E. Trenda Corliss, RN, MSN, CNS, CDCES  Diabetes Coordinator Inpatient Glycemic Control Team Team Pager [phone] (8am-5pm) 08/29/2024 10:55 AM

## 2024-08-29 NOTE — Plan of Care (Signed)
  Problem: Education: Goal: Ability to describe self-care measures that may prevent or decrease complications (Diabetes Survival Skills Education) will improve Outcome: Progressing   Problem: Coping: Goal: Ability to adjust to condition or change in health will improve Outcome: Progressing   Problem: Metabolic: Goal: Ability to maintain appropriate glucose levels will improve Outcome: Progressing   Problem: Fluid Volume: Goal: Ability to maintain a balanced intake and output will improve Outcome: Progressing   Problem: Education: Goal: Knowledge of General Education information will improve Description: Including pain rating scale, medication(s)/side effects and non-pharmacologic comfort measures Outcome: Progressing   Problem: Clinical Measurements: Goal: Ability to maintain clinical measurements within normal limits will improve Outcome: Progressing   Problem: Clinical Measurements: Goal: Will remain free from infection Outcome: Progressing   Problem: Clinical Measurements: Goal: Diagnostic test results will improve Outcome: Progressing   Problem: Activity: Goal: Risk for activity intolerance will decrease Outcome: Progressing   Problem: Safety: Goal: Ability to remain free from injury will improve Outcome: Progressing   Problem: Skin Integrity: Goal: Risk for impaired skin integrity will decrease Outcome: Progressing

## 2024-08-29 NOTE — Plan of Care (Signed)
  Problem: Safety: Goal: Ability to remain free from injury will improve Outcome: Progressing   Problem: Elimination: Goal: Will not experience complications related to bowel motility Outcome: Progressing   Problem: Nutrition: Goal: Adequate nutrition will be maintained Outcome: Progressing   Problem: Activity: Goal: Risk for activity intolerance will decrease Outcome: Progressing   Problem: Pain Managment: Goal: General experience of comfort will improve and/or be controlled Outcome: Progressing

## 2024-08-29 NOTE — Discharge Summary (Signed)
 Physician Discharge Summary   Patient: Jorge Mason MRN: [id number] DOB: 11/24/1970  Admit date:     08/25/2024  Discharge date: 08/29/24  Discharge Physician: Jorge Mason   PCP: Jorge Mason   Recommendations at discharge:   Follow-up PCP Jorge Mason in 5 days Follow-up 1 week podiatry Dr. Magdalen Follow-up Dr. Epifanio infectious disease 12 days  Discharge Diagnoses: Principal Problem:   Osteomyelitis of great toe of left foot (HCC) Active Problems:   Uncontrolled type 2 diabetes mellitus with hyperglycemia, without long-term current use of insulin  (HCC)   Hypertension   History of stroke   Diabetic foot ulcer (HCC)   Overweight (BMI 25.0-29.9)   CAD (coronary artery disease)   Diabetic foot infection (HCC)   Uncontrolled type 2 diabetes mellitus with hyperglycemia (HCC)   Open wound of left great toe   Cellulitis of left foot  Resolved Problems:   * Mason resolved hospital problems. *  Hospital Course: [name] is a 53 y.o. male with medical history significant of hypertension, diabetes mellitus, coronary disease status post PCI and stent.  Currently on dual antiplatelet therapy with aspirin  and Plavix .  Patient has been in his usual state of health until about 2 weeks ago when he saw a blister on his left foot.  He took the skin off but has noted worsening foot ulcer.  MRI of the foot showed early osteomyelitis with a small abscess. Patient has been seen by podiatry, had a debridement.  Decision was made not to perform amputation, seen by ID, currently treated with Zyvox and Unasyn. Final culture results still pending from the wound.  12/3.  Moderate Proteus mirabilis and moderate Enterococcus faecalis growing out of wound culture sensitivities pending.  Diabetic teaching on how to inject insulin  done by diabetes coordinator.  12/4.  Case discussed with infectious disease specialist and recommended 2 weeks of Levaquin and Augmentin.  Patient is stable for  discharge home.   Assessment and Plan: * Osteomyelitis of great toe of left foot [facility]) Patient decided on conservative therapy.  Patient had a bedside I&D.  Did not want amputation at this point.  Received Unasyn and Zyvox here.  Wound culture growing Proteus and Enterococcus.  Sensitivities came back on wound culture on 12/4.  Infectious disease specialist recommended 2 weeks of Levaquin and Augmentin.  Close follow-up with podiatry.  Uncontrolled type 2 diabetes mellitus with hyperglycemia, without long-term current use of insulin  (HCC) Hemoglobin A1c elevated at 13.  Patient currently on Semglee insulin  and will increase to 15 units daily.  NovoLog  to 6 units units 3 times daily plus sliding scale.  Diabetic diet discussed.  Diabetes coordinator taught the patient how to inject insulin .  Prescribed pens through the [facility] pharmacy.  Prescribed diabetic supplies through the [facility] pharmacy.  Overweight (BMI 25.0-29.9) BMI 28.48  History of stroke On aspirin  and Plavix   Hypertension On lisinopril          Consultants: Podiatry and infectious disease Procedures performed: Bedside debridement Disposition: Home Diet recommendation:  Cardiac and Carb modified diet DISCHARGE MEDICATION: Allergies as of 08/29/2024   Mason Known Allergies      Medication List     TAKE these medications    amoxicillin-clavulanate 875-125 MG tablet Commonly known as: AUGMENTIN Take 1 tablet by mouth every 12 (twelve) hours for 14 days.   aspirin  EC 81 MG tablet Take 81 mg by mouth daily.   atorvastatin  80 MG tablet Commonly known as: LIPITOR  Take 1 tablet (80 mg total)  by mouth daily.   Basaglar KwikPen 100 UNIT/ML Inject 15 Units into the skin daily. May substitute as needed per insurance.   clopidogrel  75 MG tablet Commonly known as: PLAVIX  Take 75 mg by mouth daily.   glucagon Emergency 1 MG Solr Inject 1 mg into the skin as needed for up to 2 doses (Severe low blood sugar).   HumaLOG  KwikPen 100 UNIT/ML KwikPen Generic drug: insulin  lispro Inject 6 Units into the skin 3 (three) times daily with meals. If eating and Blood Glucose (BG) 80 or higher inject 6 units for meal coverage and add correction dose per scale. If not eating, correction dose only. BG <150= 0 unit; BG 150-200= 1 unit; BG 201-250= 2 unit; BG 251-300= 3 unit; BG 301-350= 4 unit; BG 351-400= 5 unit; BG >400= 6 unit and Call Primary care.   Insupen Pen Needles 31G X 5 MM Misc Generic drug: Insulin  Pen Needle 1 each by Does not apply route as directed. Dispense based on patient and insurance preference. Use up to four times daily as directed. (FOR ICD-10 E10.9, E11.9).   levofloxacin 500 MG tablet Commonly known as: LEVAQUIN Take 1 tablet (500 mg total) by mouth daily for 14 days. Start taking on: August 30, 2024   lisinopril  20 MG tablet Commonly known as: ZESTRIL  Take 20 mg by mouth daily.   metFORMIN 500 MG 24 hr tablet Commonly known as: GLUCOPHAGE-XR Take 1,000 mg by mouth 2 (two) times daily.   True Metrix Blood Glucose Test test strip Generic drug: glucose blood Use as directed to check blood sugar four times daily.   True Metrix Meter w/Device Kit Use as directed to check blood sugar four times daily.   TRUEdraw Lancing Device Misc Use as directed to check blood sugar four times daily.   TRUEplus Lancets 33G Misc Use as directed to check blood sugar four times daily.        Follow-up Information     Jorge Mason, DPM Follow up in 1 week(s).   Specialty: Podiatry Contact information: [address] [place] [address] [phone]         Jorge Jorge Sharper, MD Follow up in 5 day(s).   Specialty: Family Medicine Contact information: [address] [facility] [address] [place] [address] [phone]         Jorge Alm SQUIBB, MD Follow up in 12 day(s).   Specialty: Infectious Diseases Why: infectious disease follow up Contact information: [address] [place] [address] [phone]                Discharge Exam: Jorge Mason   08/25/24 1126  Weight: 95.3 kg   Physical Exam HENT:     Head: Normocephalic.     Mouth/Throat:     Pharynx: Mason oropharyngeal exudate.  Eyes:     General: Lids are normal.     Conjunctiva/sclera: Conjunctivae normal.  Cardiovascular:     Rate and Rhythm: Normal rate and regular rhythm.     Heart sounds: Normal heart sounds, S1 normal and S2 normal.  Pulmonary:     Breath sounds: Mason decreased breath sounds, wheezing, rhonchi or rales.  Abdominal:     Palpations: Abdomen is soft.     Tenderness: There is Mason abdominal tenderness.  Musculoskeletal:     Right lower leg: Mason swelling.     Left lower leg: Mason swelling.  Skin:    General: Skin is warm.     Comments: Right foot  covered  Neurological:     Mental Status: He is alert and oriented to person, place, and time.      Condition at discharge: stable  The results of significant diagnostics from this hospitalization (including imaging, microbiology, ancillary and laboratory) are listed below for reference.   Imaging Studies: MR FOOT LEFT W WO CONTRAST Result Date: 08/26/2024 CLINICAL DATA:  Foot pain, chronic, inflammatory arthritis suspected, xray done History provided for earlier radiographs was a left great toe wound for 2 weeks with history of underlying diabetes. EXAM: MRI OF THE LEFT FOREFOOT WITHOUT AND WITH CONTRAST TECHNIQUE: Multiplanar, multisequence MR imaging of the left forefoot was performed both before and after administration of intravenous contrast. CONTRAST:  9mL GADAVIST GADOBUTROL 1 MMOL/ML IV SOLN COMPARISON:  Radiographs 08/25/2024 FINDINGS: Bones/Joint/Cartilage Focal skin ulceration medial to the interphalangeal joint of the great toe. The soft tissue findings are further described below. Abnormal T2 hyperintensity and low level enhancement within the adjacent distal phalanx of the great toe as well  as the distal half of the proximal phalanx. Mason definite cortical destruction or T1 marrow abnormality. There is a small interphalangeal joint effusion. Mason significant effusion at the 1st metatarsophalangeal joint. The tibial sesamoid of the 1st metatarsal is bipartite without suspicious marrow abnormality. The additional metatarsals and digits appear unremarkable. Ligaments Intact Lisfranc ligament. The collateral ligaments of the metatarsophalangeal joints are intact. Muscles and Tendons T2 hyperintensity throughout the forefoot musculature. There is a small amount of fluid within the flexor hallucis and digitorum longus tendon sheath at the level of the midfoot. Mason associated suspicious synovial enhancement. Soft tissues As above, soft tissue wound medial to the interphalangeal joint of the great toe. There is an underlying ill-defined fluid collection with peripheral enhancement, measuring approximately 1.9 x 1.6 x 0.7 cm. There is surrounding soft tissue edema and enhancement without additional focal fluid collection. Mason evidence of foreign body or soft tissue emphysema. IMPRESSION: 1. Soft tissue wound medial to the interphalangeal joint of the great toe with underlying ill-defined fluid collection suspicious for a small abscess. 2. Abnormal marrow signal and enhancement within the adjacent distal phalanx of the great toe and distal half of the proximal phalanx of the great toe, suspicious for early osteomyelitis. 3. Nonspecific mild flexor tenosynovitis at the level of the midfoot. Forefoot muscular T2 hyperintensity, likely related to underlying diabetes. Electronically Signed   By: Elsie Perone M.D.   On: 08/26/2024 08:13   DG Foot Complete Left Result Date: 08/25/2024 CLINICAL DATA:  Wound left great toe 2 weeks.  Diabetes. EXAM: LEFT FOOT - COMPLETE 3+ VIEW COMPARISON:  None Available. FINDINGS: Exam demonstrates Mason evidence of acute fracture or dislocation. Minimal degenerative changes over the  interphalangeal joints. Mason evidence of bone destruction to suggest osteomyelitis. Mason air within the soft tissues. Mild soft tissue swelling over the first toe. IMPRESSION: 1. Mason acute findings. 2. Mild soft tissue swelling over the first toe. Electronically Signed   By: Toribio Agreste M.D.   On: 08/25/2024 12:32    Microbiology: Results for orders placed or performed during the hospital encounter of 08/25/24  Blood culture (routine x 2)     Status: None (Preliminary result)   Collection Time: 08/25/24 12:54 PM   Specimen: BLOOD  Result Value Ref Range Status   Specimen Description BLOOD BLOOD LEFT HAND  Final   Special Requests   Final    BOTTLES DRAWN AEROBIC AND ANAEROBIC Blood Culture adequate volume   Culture   Final  Mason GROWTH 3 DAYS Performed at [facility], [address] [place], [place] [address]    Report Status PENDING  Incomplete  Blood culture (routine x 2)     Status: None (Preliminary result)   Collection Time: 08/25/24 12:54 PM   Specimen: BLOOD  Result Value Ref Range Status   Specimen Description BLOOD BLOOD RIGHT HAND  Final   Special Requests   Final    BOTTLES DRAWN AEROBIC AND ANAEROBIC Blood Culture adequate volume   Culture   Final    Mason GROWTH 3 DAYS Performed at [facility], [address], [place], [place] [address]    Report Status PENDING  Incomplete  Aerobic/Anaerobic Culture w Gram Stain (surgical/deep wound)     Status: None (Preliminary result)   Collection Time: 08/26/24 12:35 PM   Specimen: Abscess  Result Value Ref Range Status   Specimen Description   Final    ABSCESS Performed at [facility], [address], [place], [place] [address]    Special Requests   Final    NONE Performed at [facility], [address] [place], [place] [address]    Gram Stain   Final    RARE WBC SEEN RARE VONNE POSITIVE COCCI RARE GRAM NEGATIVE RODS Performed at [facility], [address], [place], [place]  [address]    Culture   Final    MODERATE PROTEUS MIRABILIS MODERATE ENTEROCOCCUS FAECALIS Mason ANAEROBES ISOLATED; CULTURE IN PROGRESS FOR 5 DAYS    Report Status PENDING  Incomplete   Organism ID, Bacteria PROTEUS MIRABILIS  Final   Organism ID, Bacteria ENTEROCOCCUS FAECALIS  Final      Susceptibility   Enterococcus faecalis - MIC*    AMPICILLIN <=2 SENSITIVE Sensitive     VANCOMYCIN 1 SENSITIVE Sensitive     GENTAMICIN SYNERGY SENSITIVE Sensitive     * MODERATE ENTEROCOCCUS FAECALIS   Proteus mirabilis - MIC*    AMPICILLIN >=32 RESISTANT Resistant     CEFAZOLIN (NON-URINE) >=32 RESISTANT Resistant     CEFEPIME <=0.12 SENSITIVE Sensitive     ERTAPENEM <=0.12 SENSITIVE Sensitive     CEFTRIAXONE 1 SENSITIVE Sensitive     CIPROFLOXACIN <=0.06 SENSITIVE Sensitive     GENTAMICIN <=1 SENSITIVE Sensitive     MEROPENEM 1 SENSITIVE Sensitive     TRIMETH/SULFA <=20 SENSITIVE Sensitive     AMPICILLIN/SULBACTAM >=32 RESISTANT Resistant     PIP/TAZO Value in next row Sensitive      <=4 SENSITIVEThis is a modified FDA-approved test that has been validated and its performance characteristics determined by the reporting laboratory.  This laboratory is certified under the Clinical Laboratory Improvement Amendments CLIA as qualified to perform high complexity clinical laboratory testing.    * MODERATE PROTEUS MIRABILIS    Labs: CBC: Recent Labs  Lab 08/25/24 1151 08/26/24 0623  WBC 10.5 9.0  NEUTROABS 7.4  --   HGB 12.2* 11.9*  HCT 37.0* 36.6*  MCV 88.1 89.5  PLT 319 315   Basic Metabolic Panel: Recent Labs  Lab 08/25/24 1151 08/26/24 0623  NA 134* 135  K 4.0 4.0  CL 97* 101  CO2 25 26  GLUCOSE 405* 217*  BUN 20 16  CREATININE 1.15 1.05  CALCIUM  9.1 8.6*   Liver Function Tests: Recent Labs  Lab 08/25/24 1250  AST 16  ALT 12  ALKPHOS 105  BILITOT 1.2  PROT 7.4  ALBUMIN 4.0   CBG: Recent Labs  Lab 08/28/24 1144 08/28/24  1644 08/28/24 2036 08/29/24 0743  08/29/24 1128  GLUCAP 212* 244* 253* 208* 196*    Discharge time spent: greater than 30 minutes.  Signed: Charlie Patterson, MD [facility] 08/29/2024

## 2024-08-29 NOTE — Progress Notes (Signed)
 Patient has been discharged to home. He is alert and oriented x4. Able to make needs known. Meds to bed have been delivered.  Post op shoe tio left foot is on. Patient  educated at discharge with understanding of antibiotic and diabetic supplies. IV removed with catheter and tip intact. Gauze and tape applied to site. Patient has not had any narcotics this shift. He will be driving self home. Staff to transport patient to ER parking to car.

## 2024-08-30 LAB — CULTURE, BLOOD (ROUTINE X 2)
Culture: NO GROWTH
Culture: NO GROWTH
Special Requests: ADEQUATE
Special Requests: ADEQUATE

## 2024-08-31 LAB — AEROBIC/ANAEROBIC CULTURE W GRAM STAIN (SURGICAL/DEEP WOUND)

## 2024-09-05 ENCOUNTER — Ambulatory Visit (INDEPENDENT_AMBULATORY_CARE_PROVIDER_SITE_OTHER): Payer: Self-pay

## 2024-09-05 ENCOUNTER — Ambulatory Visit: Payer: Self-pay

## 2024-09-05 DIAGNOSIS — E119 Type 2 diabetes mellitus without complications: Secondary | ICD-10-CM

## 2024-09-05 DIAGNOSIS — E11621 Type 2 diabetes mellitus with foot ulcer: Secondary | ICD-10-CM

## 2024-09-05 DIAGNOSIS — L97528 Non-pressure chronic ulcer of other part of left foot with other specified severity: Secondary | ICD-10-CM

## 2024-09-05 DIAGNOSIS — M869 Osteomyelitis, unspecified: Secondary | ICD-10-CM

## 2024-09-05 DIAGNOSIS — E1165 Type 2 diabetes mellitus with hyperglycemia: Secondary | ICD-10-CM

## 2024-09-05 NOTE — Progress Notes (Signed)
 Subjective:  Patient ID: Jorge Mason, male    DOB: 1971-09-04,  MRN: [id number]  Chief Complaint  Patient presents with   Routine Post Op    hospital follow up left foot great toe ulcer. He has had some pain over the last few days    53 y.o. male presents with the above complaint. He is following up from hospital admission 11/30-12/4/25. MRI imaging at hospital showed early changes of osteomyelitis. Patient refused amputation and is pursuing abx therapy. He is on abx from ID.   Review of Systems: Negative except as noted in the HPI. Denies N/V/F/Ch.  Past Medical History:  Diagnosis Date   Diabetes mellitus without complication (HCC)    Hypertension    Neuropathy    Current Medications[1]  Tobacco Use History[2]  Allergies[3] Objective:  There were no vitals filed for this visit. There is no height or weight on file to calculate BMI. Constitutional Well developed. Well nourished. Oriented to person, place, and time.  Vascular Dorsalis pedis pulses palpable bilaterally. Posterior tibial pulses palpable bilaterally. Capillary refill normal to all digits.  No cyanosis or clubbing noted. Pedal hair growth normal.  Neurologic Normal speech. Epicritic sensation to light touch grossly present bilaterally. Negative tinel sign at tarsal tunnel bilaterally.   Dermatologic Skin texture and turgor are within normal limits.  Open wound to medial left hallux. Minimal seropurulent drainage. Intensity and spread of erythema improved, extending only to base of hallux. No fluctuance. Wound bed primarily fibrotic.  No skin lesions.  Musculoskeletal: 5/5 muscle strength without contributing deformity.    Radiographs: Taken and reviewed of the left foot. Slight irregularity in medial cortex at base of distal phalanx, suggesting possible osteomyelitis. No other cortical irregularities or acute osseous pathology identified. Free air noted at medial hallux, likely secondary to open  wound.   Microbiology from abscess culture 08/26/24:    Component Ref Range & Units (hover) 10 d ago  Specimen Description ABSCESS Performed at [facility], [address] [place], [place] [address]  Special Requests NONE Performed at [facility], [address] [place], [place] [address]  Gram Stain RARE WBC SEEN RARE GRAM POSITIVE COCCI RARE GRAM NEGATIVE RODS  Culture MODERATE PROTEUS MIRABILIS MODERATE ENTEROCOCCUS FAECALIS    Assessment:   1. Diabetes mellitus without complication (HCC)   2. Uncontrolled type 2 diabetes mellitus with hyperglycemia, without long-term current use of insulin  (HCC)   3. Diabetic ulcer of toe of left foot associated with type 2 diabetes mellitus, with other ulcer severity (HCC)   4. Osteomyelitis of great toe of left foot (HCC)    Plan:  - Patient was evaluated and treated and all questions answered.  Open wound left hallux with early OM per MRI - Patient following up after hospital admission for open wound to his left hallux.  He did have MRI imaging that showed possible early osteomyelitis.  We did discuss different treatment options at the time, I recommended amputation for definitive source control.  He wished to pursue antibiotic therapy.  He was discharged on 2 weeks of oral antibiotics per ID.  He still has over 1 week left and is to use them to completion.  Clinically, the area has improved but still persistent erythema. - We discussed that he is to do once daily dressing changes utilizing Betadine wet-to-dry.  He did express understanding of this. -We discussed that he is to monitor the area for erythema as well as purulence or  other signs of infection.  If the area does become acutely infected or worsens he should present to the emergency department.  Would likely require amputation of the hallux. - Emphasized the importance of tight glycemic control - Follow-up with ID as discussed with their service -Return to clinic in  2 weeks for repeat evaluation    Prentice Ovens, DPM AACFAS Fellowship Trained Podiatric Surgeon [facility]      [1]  Current Outpatient Medications:    amoxicillin -clavulanate (AUGMENTIN ) 875-125 MG tablet, Take 1 tablet by mouth every 12 (twelve) hours for 14 days., Disp: 28 tablet, Rfl: 0   aspirin  EC 81 MG tablet, Take 81 mg by mouth daily., Disp: , Rfl:    atorvastatin  (LIPITOR ) 80 MG tablet, Take 1 tablet (80 mg total) by mouth daily., Disp: 30 tablet, Rfl: 11   Blood Glucose Monitoring Suppl (BLOOD GLUCOSE MONITOR SYSTEM) w/Device KIT, Use as directed to check blood sugar four times daily., Disp: 1 kit, Rfl: 0   clopidogrel  (PLAVIX ) 75 MG tablet, Take 75 mg by mouth daily., Disp: , Rfl:    glucagon  Emergency 1 MG SOLR, Inject 1 mg into the skin as needed for up to 2 doses (Severe low blood sugar)., Disp: 2 each, Rfl: 0   Glucose Blood (BLOOD GLUCOSE TEST STRIPS) STRP, Use as directed to check blood sugar four times daily., Disp: 200 each, Rfl: 0   Insulin  Glargine (BASAGLAR  KWIKPEN) 100 UNIT/ML, Inject 15 Units into the skin daily. May substitute as needed per insurance., Disp: 15 mL, Rfl: 0   insulin  lispro (HUMALOG ) 100 UNIT/ML KwikPen, Inject 6 Units into the skin 3 (three) times daily with meals. If eating and Blood Glucose (BG) 80 or higher inject 6 units for meal coverage and add correction dose per scale. If not eating, correction dose only. BG <150= 0 unit; BG 150-200= 1 unit; BG 201-250= 2 unit; BG 251-300= 3 unit; BG 301-350= 4 unit; BG 351-400= 5 unit; BG >400= 6 unit and Call Primary care., Disp: 15 mL, Rfl: 0   Insulin  Pen Needle (PEN NEEDLES) 31G X 5 MM MISC, 1 each by Does not apply route as directed. Dispense based on patient and insurance preference. Use up to four times daily as directed. (FOR ICD-10 E10.9, E11.9)., Disp: 200 each, Rfl: 0   Lancet Device MISC, Use as directed to check blood sugar four times daily., Disp: 1 each, Rfl: 0   Lancets MISC,  Use as directed to check blood sugar four times daily., Disp: 200 each, Rfl: 0   levofloxacin  (LEVAQUIN ) 500 MG tablet, Take 1 tablet (500 mg total) by mouth daily for 14 days., Disp: 14 tablet, Rfl: 0   lisinopril  (ZESTRIL ) 20 MG tablet, Take 20 mg by mouth daily., Disp: , Rfl:    metFORMIN (GLUCOPHAGE-XR) 500 MG 24 hr tablet, Take 1,000 mg by mouth 2 (two) times daily., Disp: , Rfl:  [2]  Social History Tobacco Use  Smoking Status Never  Smokeless Tobacco Never  [3] No Known Allergies
